# Patient Record
Sex: Female | Born: 1975 | Race: White | Hispanic: No | Marital: Married | Smoking: Current every day smoker
Health system: Southern US, Community
[De-identification: ages and names within clinical notes are randomized; demographics above are authoritative.]

## PROBLEM LIST (undated history)

## (undated) ENCOUNTER — Emergency Department (HOSPITAL_COMMUNITY): Discharge status: Against Medical Advice | Payer: BC Managed Care – PPO

## (undated) DIAGNOSIS — K219 Gastro-esophageal reflux disease without esophagitis: Secondary | ICD-10-CM

## (undated) DIAGNOSIS — D72825 Bandemia: Secondary | ICD-10-CM

## (undated) DIAGNOSIS — F419 Anxiety disorder, unspecified: Secondary | ICD-10-CM

## (undated) DIAGNOSIS — T8859XA Other complications of anesthesia, initial encounter: Secondary | ICD-10-CM

## (undated) DIAGNOSIS — D649 Anemia, unspecified: Secondary | ICD-10-CM

## (undated) DIAGNOSIS — D472 Monoclonal gammopathy: Secondary | ICD-10-CM

## (undated) DIAGNOSIS — G43909 Migraine, unspecified, not intractable, without status migrainosus: Secondary | ICD-10-CM

## (undated) DIAGNOSIS — E039 Hypothyroidism, unspecified: Secondary | ICD-10-CM

## (undated) DIAGNOSIS — Z8719 Personal history of other diseases of the digestive system: Secondary | ICD-10-CM

## (undated) DIAGNOSIS — R51 Headache: Secondary | ICD-10-CM

## (undated) DIAGNOSIS — M1990 Unspecified osteoarthritis, unspecified site: Secondary | ICD-10-CM

## (undated) DIAGNOSIS — K589 Irritable bowel syndrome without diarrhea: Secondary | ICD-10-CM

## (undated) DIAGNOSIS — I341 Nonrheumatic mitral (valve) prolapse: Secondary | ICD-10-CM

## (undated) DIAGNOSIS — R569 Unspecified convulsions: Secondary | ICD-10-CM

## (undated) DIAGNOSIS — M797 Fibromyalgia: Secondary | ICD-10-CM

## (undated) DIAGNOSIS — N809 Endometriosis, unspecified: Secondary | ICD-10-CM

## (undated) DIAGNOSIS — K5792 Diverticulitis of intestine, part unspecified, without perforation or abscess without bleeding: Secondary | ICD-10-CM

## (undated) DIAGNOSIS — I10 Essential (primary) hypertension: Secondary | ICD-10-CM

## (undated) DIAGNOSIS — I499 Cardiac arrhythmia, unspecified: Secondary | ICD-10-CM

## (undated) DIAGNOSIS — T4145XA Adverse effect of unspecified anesthetic, initial encounter: Secondary | ICD-10-CM

## (undated) DIAGNOSIS — M7660 Achilles tendinitis, unspecified leg: Secondary | ICD-10-CM

## (undated) DIAGNOSIS — R011 Cardiac murmur, unspecified: Secondary | ICD-10-CM

## (undated) DIAGNOSIS — J189 Pneumonia, unspecified organism: Secondary | ICD-10-CM

## (undated) HISTORY — DX: Migraine, unspecified, not intractable, without status migrainosus: G43.909

## (undated) HISTORY — PX: MOUTH SURGERY: SHX715

## (undated) HISTORY — PX: ABDOMINAL HYSTERECTOMY: SHX81

## (undated) HISTORY — PX: APPENDECTOMY: SHX54

## (undated) HISTORY — PX: ANTERIOR CERVICAL DECOMP/DISCECTOMY FUSION: SHX1161

## (undated) HISTORY — DX: Achilles tendinitis, unspecified leg: M76.60

## (undated) HISTORY — PX: CHOLECYSTECTOMY: SHX55

---
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
## 1998-09-02 ENCOUNTER — Encounter (HOSPITAL_COMMUNITY): Admission: RE | Admit: 1998-09-02 | Discharge: 1998-12-01 | Payer: Self-pay | Admitting: Cardiology

## 1998-11-05 ENCOUNTER — Other Ambulatory Visit: Admission: RE | Admit: 1998-11-05 | Discharge: 1998-11-05 | Payer: Self-pay | Admitting: Gastroenterology

## 1998-12-20 ENCOUNTER — Emergency Department (HOSPITAL_COMMUNITY): Admission: EM | Admit: 1998-12-20 | Discharge: 1998-12-20 | Payer: Self-pay | Admitting: Emergency Medicine

## 1998-12-20 ENCOUNTER — Encounter: Payer: Self-pay | Admitting: Emergency Medicine

## 1999-01-03 ENCOUNTER — Inpatient Hospital Stay (HOSPITAL_COMMUNITY): Admission: EM | Admit: 1999-01-03 | Discharge: 1999-01-05 | Payer: Self-pay | Admitting: Emergency Medicine

## 1999-01-05 ENCOUNTER — Encounter: Payer: Self-pay | Admitting: *Deleted

## 2000-11-25 ENCOUNTER — Emergency Department (HOSPITAL_COMMUNITY): Admission: EM | Admit: 2000-11-25 | Discharge: 2000-11-25 | Payer: Self-pay | Admitting: Emergency Medicine

## 2000-11-25 ENCOUNTER — Encounter: Payer: Self-pay | Admitting: Emergency Medicine

## 2000-12-06 ENCOUNTER — Encounter: Payer: Self-pay | Admitting: Neurology

## 2000-12-06 ENCOUNTER — Ambulatory Visit (HOSPITAL_COMMUNITY): Admission: RE | Admit: 2000-12-06 | Discharge: 2000-12-06 | Payer: Self-pay | Admitting: Neurology

## 2001-05-08 ENCOUNTER — Emergency Department (HOSPITAL_COMMUNITY): Admission: EM | Admit: 2001-05-08 | Discharge: 2001-05-09 | Payer: Self-pay | Admitting: Emergency Medicine

## 2001-12-15 ENCOUNTER — Observation Stay (HOSPITAL_COMMUNITY): Admission: EM | Admit: 2001-12-15 | Discharge: 2001-12-15 | Payer: Self-pay

## 2002-02-08 ENCOUNTER — Other Ambulatory Visit: Admission: RE | Admit: 2002-02-08 | Discharge: 2002-02-08 | Payer: Self-pay | Admitting: *Deleted

## 2002-04-05 ENCOUNTER — Encounter: Admission: RE | Admit: 2002-04-05 | Discharge: 2002-04-05 | Payer: Self-pay

## 2002-04-11 ENCOUNTER — Encounter: Payer: Self-pay | Admitting: Emergency Medicine

## 2002-04-11 ENCOUNTER — Emergency Department (HOSPITAL_COMMUNITY): Admission: EM | Admit: 2002-04-11 | Discharge: 2002-04-11 | Payer: Self-pay | Admitting: Emergency Medicine

## 2002-04-24 ENCOUNTER — Encounter: Payer: Self-pay | Admitting: Surgery

## 2002-04-27 ENCOUNTER — Encounter: Payer: Self-pay | Admitting: Surgery

## 2002-04-27 ENCOUNTER — Encounter (INDEPENDENT_AMBULATORY_CARE_PROVIDER_SITE_OTHER): Payer: Self-pay | Admitting: Specialist

## 2002-04-27 ENCOUNTER — Observation Stay (HOSPITAL_COMMUNITY): Admission: RE | Admit: 2002-04-27 | Discharge: 2002-04-28 | Payer: Self-pay | Admitting: Surgery

## 2002-07-02 ENCOUNTER — Emergency Department (HOSPITAL_COMMUNITY): Admission: EM | Admit: 2002-07-02 | Discharge: 2002-07-02 | Payer: Self-pay | Admitting: Emergency Medicine

## 2002-07-02 ENCOUNTER — Encounter: Payer: Self-pay | Admitting: Emergency Medicine

## 2002-10-11 ENCOUNTER — Encounter: Payer: Self-pay | Admitting: Emergency Medicine

## 2002-10-11 ENCOUNTER — Inpatient Hospital Stay (HOSPITAL_COMMUNITY): Admission: EM | Admit: 2002-10-11 | Discharge: 2002-10-13 | Payer: Self-pay

## 2002-10-11 ENCOUNTER — Encounter (INDEPENDENT_AMBULATORY_CARE_PROVIDER_SITE_OTHER): Payer: Self-pay | Admitting: *Deleted

## 2003-04-10 ENCOUNTER — Encounter: Payer: Self-pay | Admitting: Emergency Medicine

## 2003-04-10 ENCOUNTER — Emergency Department (HOSPITAL_COMMUNITY): Admission: EM | Admit: 2003-04-10 | Discharge: 2003-04-10 | Payer: Self-pay | Admitting: Emergency Medicine

## 2004-04-07 ENCOUNTER — Other Ambulatory Visit: Payer: Self-pay

## 2004-09-26 ENCOUNTER — Emergency Department (HOSPITAL_COMMUNITY): Admission: EM | Admit: 2004-09-26 | Discharge: 2004-09-26 | Payer: Self-pay | Admitting: Emergency Medicine

## 2005-01-21 ENCOUNTER — Emergency Department (HOSPITAL_COMMUNITY): Admission: EM | Admit: 2005-01-21 | Discharge: 2005-01-22 | Payer: Self-pay | Admitting: Emergency Medicine

## 2005-11-18 ENCOUNTER — Ambulatory Visit: Payer: Self-pay | Admitting: Gastroenterology

## 2006-04-20 ENCOUNTER — Encounter: Payer: Self-pay | Admitting: Emergency Medicine

## 2006-08-03 ENCOUNTER — Emergency Department: Payer: Self-pay | Admitting: Emergency Medicine

## 2007-01-19 ENCOUNTER — Emergency Department: Payer: Self-pay

## 2007-08-30 ENCOUNTER — Emergency Department: Payer: Self-pay | Admitting: Emergency Medicine

## 2007-08-30 ENCOUNTER — Other Ambulatory Visit: Payer: Self-pay

## 2007-09-04 ENCOUNTER — Ambulatory Visit: Payer: Self-pay | Admitting: Internal Medicine

## 2008-02-07 ENCOUNTER — Ambulatory Visit: Payer: Self-pay | Admitting: Internal Medicine

## 2008-02-22 ENCOUNTER — Ambulatory Visit: Payer: Self-pay | Admitting: Oncology

## 2008-04-09 ENCOUNTER — Ambulatory Visit: Payer: Self-pay | Admitting: Oncology

## 2008-04-11 LAB — CBC WITH DIFFERENTIAL (CANCER CENTER ONLY)
BASO#: 0.1 10*3/uL (ref 0.0–0.2)
BASO%: 0.8 % (ref 0.0–2.0)
EOS%: 4.4 % (ref 0.0–7.0)
HCT: 37.2 % (ref 34.8–46.6)
HGB: 12.7 g/dL (ref 11.6–15.9)
LYMPH#: 3.9 10*3/uL — ABNORMAL HIGH (ref 0.9–3.3)
MCHC: 34.2 g/dL (ref 32.0–36.0)
MONO#: 0.8 10*3/uL (ref 0.1–0.9)
NEUT#: 8.5 10*3/uL — ABNORMAL HIGH (ref 1.5–6.5)
NEUT%: 61.4 % (ref 39.6–80.0)
WBC: 13.9 10*3/uL — ABNORMAL HIGH (ref 3.9–10.0)

## 2008-11-22 ENCOUNTER — Emergency Department (HOSPITAL_COMMUNITY): Admission: EM | Admit: 2008-11-22 | Discharge: 2008-11-22 | Payer: Self-pay | Admitting: Emergency Medicine

## 2009-08-31 ENCOUNTER — Emergency Department: Payer: Self-pay | Admitting: Emergency Medicine

## 2009-12-04 IMAGING — CR DG CHEST 2V
1 series · 2 of 2 positions shown · non-contrast
Comparison: none

REASON FOR EXAM: Shortness of Breath
COMMENTS:

[Series 1: view not recorded · 0.17mm/px · 2 of 2 slices shown]
[im 1/2]
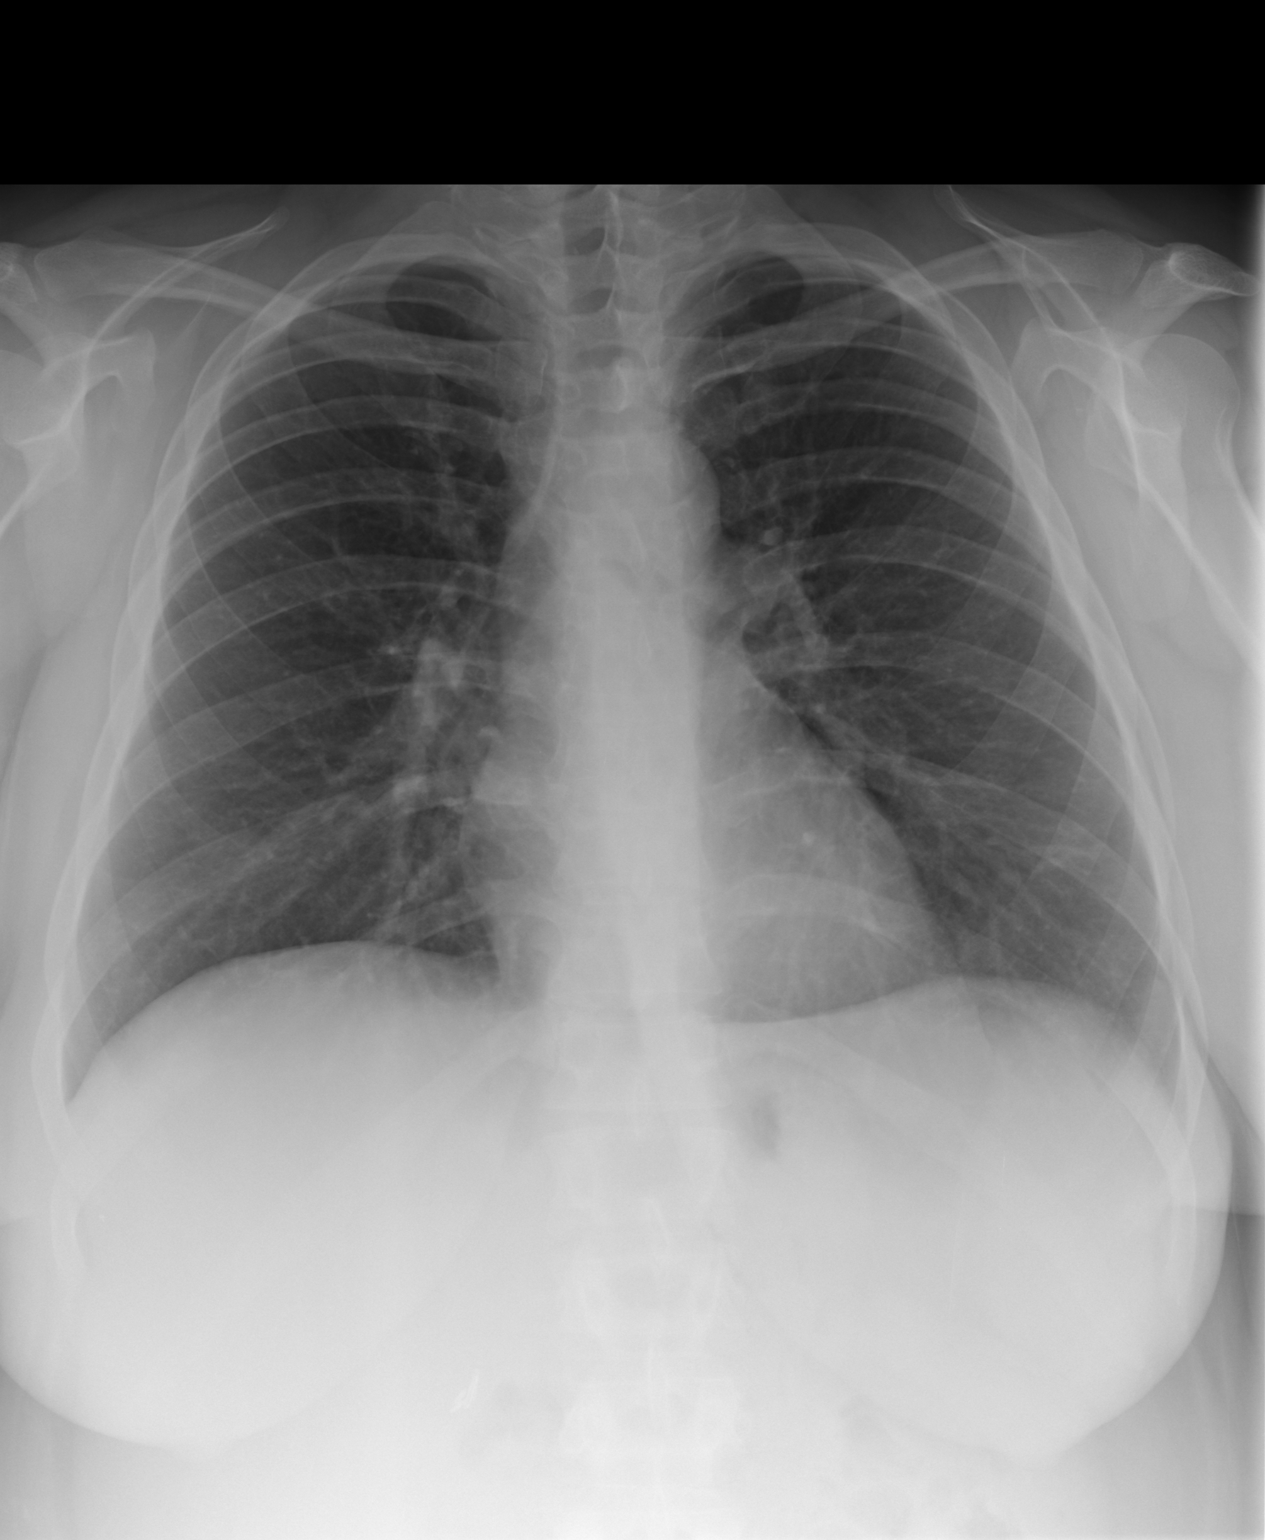
[im 2/2]
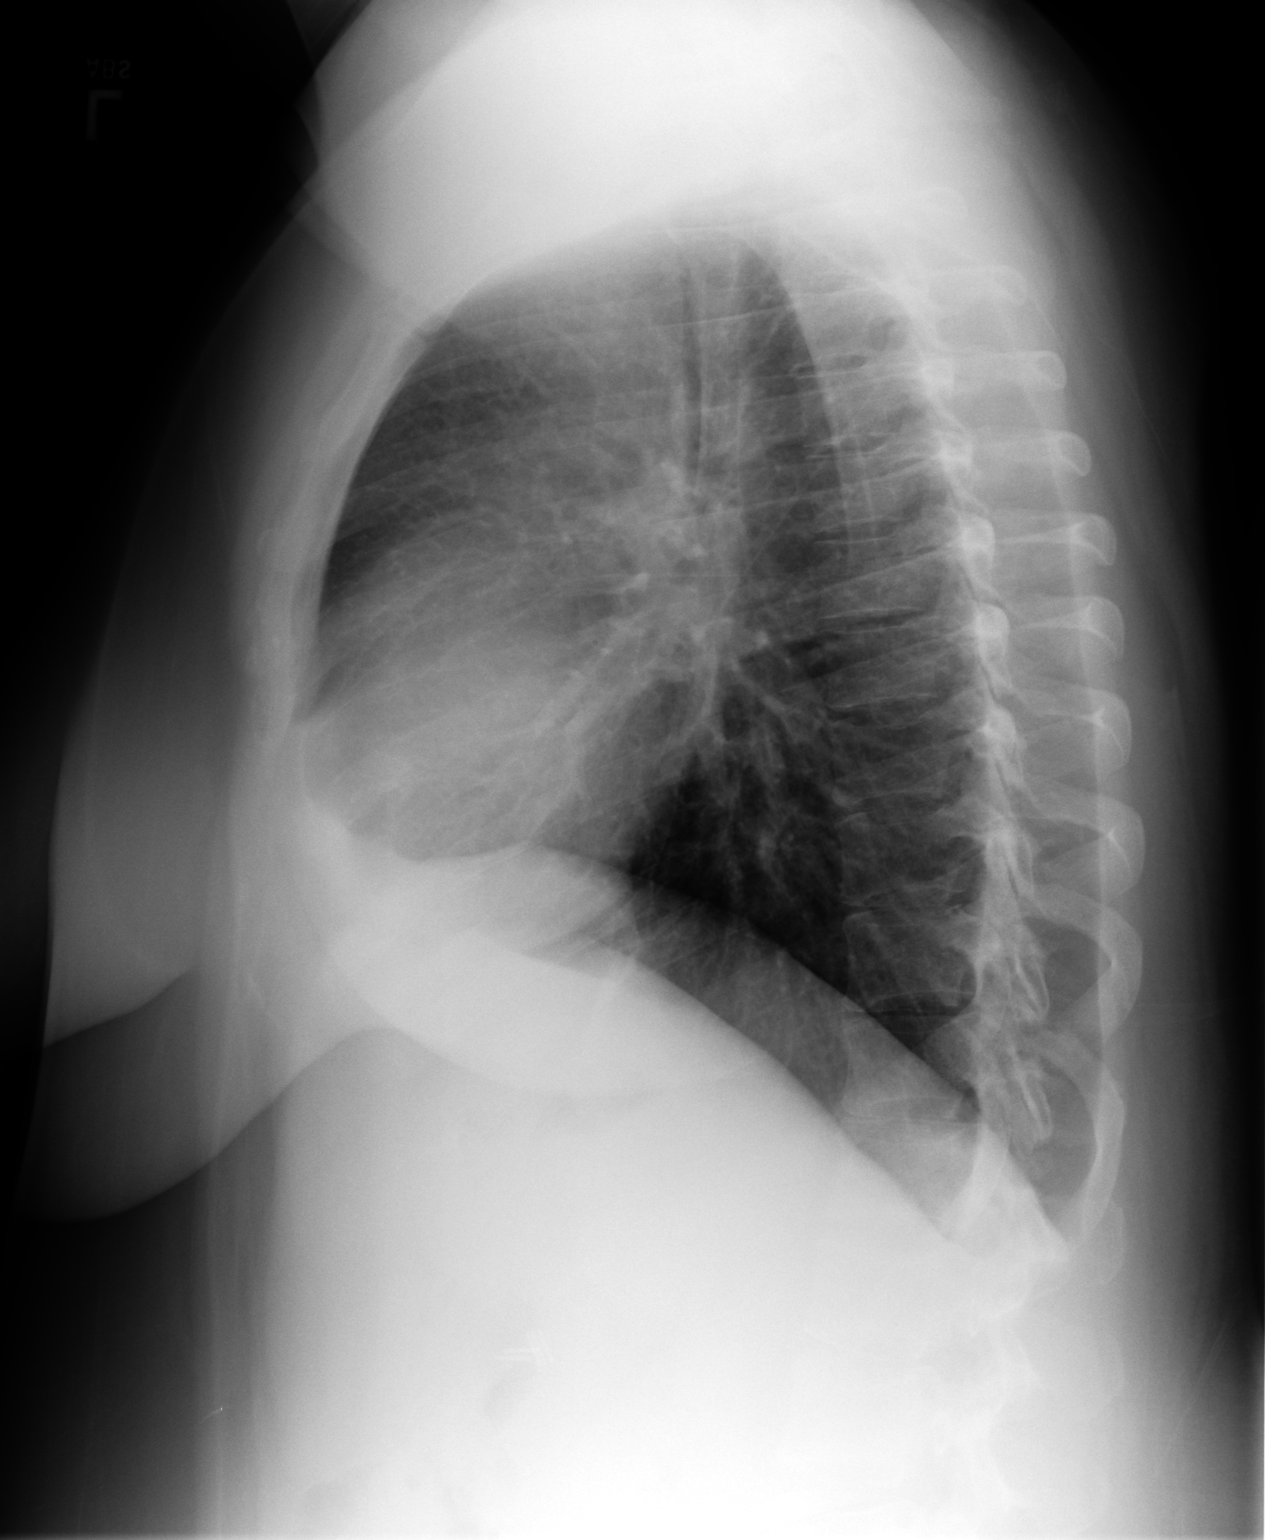

[2 of 2 positions shown; findings below may reference images not displayed]

PROCEDURE:     DXR - DXR CHEST PA (OR AP) AND LATERAL  - August 31, 2009  [DATE]

RESULT:     Comparison is made to study 30 August, 2007.

The lungs are adequately inflated. There is no focal infiltrate. The cardiac
silhouette is normal in size. The pulmonary vascularity is not engorged.
There is no pleural effusion.
IMPRESSION: I do not see evidence of acute cardiopulmonary abnormality.
I cannot exclude acute bronchitis in the appropriate clinical setting.

## 2011-05-07 NOTE — Op Note (Signed)
NAMEKAMILLA, HANDS                          ACCOUNT NO.:  [id number]   MEDICAL RECORD NO.:  [id number]                   PATIENT TYPE:  INP   LOCATION:  3016                                 FACILITY:  [facility]   PHYSICIAN:  Ollen Gross. Vernell Morgans, M.D.              DATE OF BIRTH:  18-Jan-1976   DATE OF PROCEDURE:  10/12/2002  DATE OF DISCHARGE:  10/13/2002                                 OPERATIVE REPORT   PREOPERATIVE DIAGNOSES:  Appendicitis.   POSTOPERATIVE DIAGNOSES:  Appendicitis.   PROCEDURE:  Laparoscopic appendectomy.   SURGEON:  Ollen Gross. Carolynne Edouard, M.D.   ANESTHESIA:  General endotracheal.   DESCRIPTION OF PROCEDURE:  After informed consent was obtained, the patient  was brought to the operating room, placed in a supine position on the  operating room table. After adequate induction of general anesthesia, the  patient's abdomen was prepped with Betadine and draped in the usual sterile  manner. An incision was made just below the umbilicus after infiltrating the  area with 0.25/% Marcaine. This incision was carried down to the  subcutaneous tissue using blunt dissection with the Kelly clamp and the [employer] retractors until the linea alba was identified. The linea alba was  incised with the 15 blade knife and each side was grasped with Kocher clamps  and elevated anteriorly. The preperitoneal space was then probed bluntly  with a hemostat until the peritoneum was opened and access was gained the  abdominal cavity. This portion of the procedure was very difficult due to  the patient's size, she was morbidly obese. Once this was accomplished, a #0  Vicryl pursestring stitch was placed in the fascia surrounding the opening,  a Hasson cannula was placed through the opening an anchored in place with  the previously placed Vicryl pursestring stitch. The abdomen was then  insufflated with carbon dioxide without difficulty. Once this was  accomplished, the patient was placed in  Trendelenburg position and rotated a  little bit left side down. The appendix was able to be visualized and did  appear to be acutely inflamed. Next, a small incision was made in the  suprapubic region and a 5 mm port was initially placed through this incision  to see if the appendix was mobile enough to work on. Once the port had been  placed and the appendix was able to be manipulated, a Kidner was placed  through the 5 mm port and the port was exchanged for a 12 mm port. Next, a  small stab incision was made just above the umbilicus and a 5 mm port was  placed bluntly through this incision into the abdominal cavity under direct  vision. A Glassman retractor was placed through the 12 mm port and a  harmonic scalpel was placed through the 5 mm port and the appendix was able  to be elevated. The mesoappendix was taken down with the harmonic  scalpel  until the base of the appendix was free. The Kingsley Spittle was then switched to  the 5 mm port and an endoscopic stapler was placed through the 12 mm port  and the staple was able to be placed across the base of the appendix without  difficulty and the stapler was clamped and fired dividing the appendix.  Next, an endoscopic bag was placed through the 12 mm port and the appendix  was placed within the bag and the bag was sealed. The abdomen was then  irrigated with copious amounts of saline. The laparoscope was then moved to  the 12 mm and a gallbladder grasper was placed through the Hasson cannula to  grasp the bag. The bag was removed with the Hasson cannula through the  infraumbilical port. The fascial defect was then closed with the previously  placed Vicryl pursestring stitch. The rest of the ports were then removed  under direct vision without difficulty and were all hemostatic. The skin  incisions were then closed with interrupted 4-0 Monocryl subcuticular  stitches, Benzoin, Steri-  Strips and sterile dressings were applied. The patient  tolerated the  procedure well. At the end of the case, all sponge, needle and instrument  counts were correct. The patient was awakened and taken to the recovery room  in stable condition.                                               Ollen Gross. Vernell Morgans, M.D.    PST/MEDQ  D:  10/16/2002  T:  10/16/2002  Job:  [id number]

## 2011-05-07 NOTE — Discharge Summary (Signed)
[place]  Patient:    Allison Martinez, Allison Martinez Visit Number: [id number] MRN: [id number]          Service Type: OBV Location: [id number] Attending Physician:  Edwyna Perfect Dictated by:   Jennette Kettle, M.D. Admit Date:  12/14/2001 Discharge Date: 12/15/2001   CC:         Andres Ege, M.D.   Discharge Summary  DISCHARGE DIAGNOSES: 1. Acute gastroenteritis. 2. History of depression. 3. History of hypothyroidism. 4. History of pneumonia in January 2000.  DISCHARGE MEDICATIONS: 1. Celexa 20 mg p.o. q.d. 2. Synthroid 0.05 mg p.o. q.d. 3. Imodium p.r.n. diarrhea.  FOLLOWUP:  The patient will follow up with Dr. Andres Ege on January 19, 2002, as previously scheduled.  Dr. Fayrene Fearing [place] the patients [place].  PROCEDURES AND DIAGNOSTIC STUDIES:  None.  HISTORY OF PRESENT ILLNESS:  Briefly, Allison Martinez is a 35 year old Caucasian female with a history of hypothyroidism, as well as depression, who presented on 12/14/01, with nausea, vomiting, and diarrhea which began on the morning of 12/14/01.  The patient states that she had been feeling sick that morning and was unable to eat anything.  The patient was able to pass flatus and a loose stool with it that morning.  The patient began vomiting approximately 8 to 10 times that day.  The patient denied any bright red blood per rectum or melena. The patient denied any blood with her emesis.  The patient describes her stool as being watery, yellow, and brown in nature, but no blood.  The patient complains of mild abdominal pain.  The patient states that she was handling a sick baby on Christmas Eve who had been diagnosed with some gastroenteritis.  ADMISSION LABORATORY DATA:  The patient had a CBC revealing a white count of 21.4, hemoglobin 14.0, platelets 352, with absolute neutrophil count of 20.4, with segs of 95%.  The patient had electrolytes revealing sodium  of 136, potassium 3.6, chloride 105, bicarbonate 24, BUN 12, creatinine 0.6, glucose 129.  The patients liver function tests were within normal limits.  The patient was noted to be slightly heme positive on fecal occult blood test, but had no gross blood on rectal examination.  HOSPITAL COURSE:  #1 - ACUTE GASTROENTERITIS:  The patient was admitted to the hospital for IV fluid hydration.  Likely etiology is viral versus bacterial.  The patients likely viral given history of sick contact with a baby, possible Rotavirus. The patients emesis decreased throughout her hospital stay, and she was able to tolerate p.o. on the day of discharge.  The patient was advised that further followup with her primary care physician at a regularly scheduled appointment on Tuesday, January 19, 2002, would be appropriate, of if symptoms worsened, such as noticing bright red blood per rectum or intractable nausea and vomiting, without able to take p.o., to return to the emergency room.  The patients white count subsequently dropped on the day after she was admitted to a level upon discharge of 17.0.  The patient was advised that Imodium as needed for the diarrhea.  #2 - HISTORY OF DEPRESSION:  The patient was started and placed on her home medication of Celexa, and will be discharged on her home medication of Celexa.  #3 - HISTORY OF HYPOTHYROIDISM:  The patient was started on her home dose of Synthroid.  DISCHARGE LABORATORY DATA:  On the day of discharge the patient had a CBC revealing a white  count decreased to 17.0 with absolute neutrophil count decreased to 15.5.  The patients hemoglobin was 12.7, platelets 326.  The patients electrolytes revealed sodium 138, potassium 3.4, chloride 106, bicarbonate 21, BUN 15, creatinine 0.6, glucose 121, calcium 8.2. Dictated by:   Jennette Kettle, M.D. Attending Physician:  Edwyna Perfect DD:  12/15/01 TD:  12/16/01 Job: [id number]

## 2012-01-31 ENCOUNTER — Inpatient Hospital Stay (HOSPITAL_COMMUNITY): Payer: BC Managed Care – PPO

## 2012-01-31 ENCOUNTER — Inpatient Hospital Stay (HOSPITAL_COMMUNITY)
Admission: AD | Admit: 2012-01-31 | Discharge: 2012-02-03 | DRG: 183 | Discharge status: Against Medical Advice | Payer: BC Managed Care – PPO | Source: Ambulatory Visit | Attending: Internal Medicine | Admitting: Internal Medicine

## 2012-01-31 ENCOUNTER — Encounter (HOSPITAL_COMMUNITY): Payer: Self-pay | Admitting: General Practice

## 2012-01-31 DIAGNOSIS — Z79899 Other long term (current) drug therapy: Secondary | ICD-10-CM

## 2012-01-31 DIAGNOSIS — R110 Nausea: Secondary | ICD-10-CM | POA: Diagnosis present

## 2012-01-31 DIAGNOSIS — E669 Obesity, unspecified: Secondary | ICD-10-CM | POA: Diagnosis present

## 2012-01-31 DIAGNOSIS — K5732 Diverticulitis of large intestine without perforation or abscess without bleeding: Principal | ICD-10-CM | POA: Diagnosis present

## 2012-01-31 DIAGNOSIS — E039 Hypothyroidism, unspecified: Secondary | ICD-10-CM | POA: Diagnosis present

## 2012-01-31 DIAGNOSIS — K5900 Constipation, unspecified: Secondary | ICD-10-CM | POA: Diagnosis present

## 2012-01-31 DIAGNOSIS — K5792 Diverticulitis of intestine, part unspecified, without perforation or abscess without bleeding: Secondary | ICD-10-CM | POA: Diagnosis present

## 2012-01-31 DIAGNOSIS — Z91013 Allergy to seafood: Secondary | ICD-10-CM

## 2012-01-31 DIAGNOSIS — I10 Essential (primary) hypertension: Secondary | ICD-10-CM | POA: Diagnosis present

## 2012-01-31 DIAGNOSIS — Z886 Allergy status to analgesic agent status: Secondary | ICD-10-CM

## 2012-01-31 DIAGNOSIS — Z882 Allergy status to sulfonamides status: Secondary | ICD-10-CM

## 2012-01-31 HISTORY — DX: Fibromyalgia: M79.7

## 2012-01-31 HISTORY — DX: Anemia, unspecified: D64.9

## 2012-01-31 HISTORY — DX: Hypothyroidism, unspecified: E03.9

## 2012-01-31 HISTORY — DX: Essential (primary) hypertension: I10

## 2012-01-31 HISTORY — DX: Pneumonia, unspecified organism: J18.9

## 2012-01-31 HISTORY — DX: Personal history of other diseases of the digestive system: Z87.19

## 2012-01-31 HISTORY — DX: Headache: R51

## 2012-01-31 HISTORY — DX: Diverticulitis of intestine, part unspecified, without perforation or abscess without bleeding: K57.92

## 2012-01-31 HISTORY — DX: Anxiety disorder, unspecified: F41.9

## 2012-01-31 LAB — COMPREHENSIVE METABOLIC PANEL
ALT: 23 U/L (ref 0–35)
AST: 15 U/L (ref 0–37)
CO2: 26 mEq/L (ref 19–32)
Chloride: 101 mEq/L (ref 96–112)
Creatinine, Ser: 0.65 mg/dL (ref 0.50–1.10)
GFR calc non Af Amer: >90 mL/min (ref >90)
Total Bilirubin: 0.3 mg/dL (ref 0.3–1.2)

## 2012-01-31 LAB — CBC
MCH: 29.3 pg (ref 26.0–34.0)
MCHC: 35.8 g/dL (ref 30.0–36.0)
Platelets: 313 10*3/uL (ref 150–400)
RBC: 4.82 MIL/uL (ref 3.87–5.11)
RDW: 12.8 % (ref 11.5–15.5)

## 2012-01-31 LAB — PROTIME-INR: Prothrombin Time: 14.3 seconds (ref 11.6–15.2)

## 2012-01-31 MED ORDER — ONDANSETRON HCL 4 MG/2ML IJ SOLN
4.0000 mg | Freq: Four times a day (QID) | INTRAMUSCULAR | Status: DC | PRN
  Administered 2012-02-03: 4 mg via INTRAVENOUS
  Filled 2012-01-31: qty 2

## 2012-01-31 MED ORDER — SODIUM CHLORIDE 0.9 % IV SOLN
1.0000 g | Freq: Three times a day (TID) | INTRAVENOUS | Status: DC
  Administered 2012-01-31 – 2012-02-03 (×10): 1 g via INTRAVENOUS
  Filled 2012-01-31 (×13): qty 1

## 2012-01-31 MED ORDER — IOHEXOL 300 MG/ML  SOLN: 20.0000 mL | INTRAMUSCULAR | Status: AC

## 2012-01-31 MED ORDER — TOPIRAMATE 25 MG PO TABS: 50.0000 mg | ORAL_TABLET | Freq: Two times a day (BID) | ORAL | Status: DC

## 2012-01-31 MED ORDER — HYDROMORPHONE HCL PF 1 MG/ML IJ SOLN
INTRAMUSCULAR | Status: AC
  Administered 2012-01-31: 1 mg via INTRAVENOUS
  Filled 2012-01-31: qty 1

## 2012-01-31 MED ORDER — IOHEXOL 300 MG/ML  SOLN
100.0000 mL | Freq: Once | INTRAMUSCULAR | Status: AC | PRN
  Administered 2012-01-31: 100 mL via INTRAVENOUS

## 2012-01-31 MED ORDER — WHITE PETROLATUM GEL
Status: AC
  Filled 2012-01-31: qty 5

## 2012-01-31 MED ORDER — HYDROCHLOROTHIAZIDE 25 MG PO TABS
25.0000 mg | ORAL_TABLET | Freq: Every day | ORAL | Status: DC
  Administered 2012-01-31 – 2012-02-03 (×4): 25 mg via ORAL
  Filled 2012-01-31 (×4): qty 1

## 2012-01-31 MED ORDER — TOPIRAMATE 25 MG PO TABS
50.0000 mg | ORAL_TABLET | Freq: Two times a day (BID) | ORAL | Status: DC
  Administered 2012-01-31 – 2012-02-03 (×6): 50 mg via ORAL
  Filled 2012-01-31 (×7): qty 2

## 2012-01-31 MED ORDER — ONDANSETRON HCL 4 MG PO TABS
4.0000 mg | ORAL_TABLET | Freq: Four times a day (QID) | ORAL | Status: DC | PRN
  Administered 2012-02-02: 4 mg via ORAL
  Filled 2012-01-31 (×2): qty 1

## 2012-01-31 MED ORDER — LEVOTHYROXINE SODIUM 75 MCG PO TABS
75.0000 ug | ORAL_TABLET | Freq: Every day | ORAL | Status: DC
  Administered 2012-01-31 – 2012-02-03 (×4): 75 ug via ORAL
  Filled 2012-01-31 (×5): qty 1

## 2012-01-31 MED ORDER — HYDROMORPHONE HCL PF 1 MG/ML IJ SOLN
1.0000 mg | INTRAMUSCULAR | Status: DC | PRN
  Administered 2012-01-31 – 2012-02-01 (×8): 1 mg via INTRAVENOUS
  Filled 2012-01-31 (×9): qty 1

## 2012-01-31 MED ORDER — KCL IN DEXTROSE-NACL 20-5-0.45 MEQ/L-%-% IV SOLN
INTRAVENOUS | Status: DC
  Administered 2012-01-31 (×2): via INTRAVENOUS
  Administered 2012-02-01: 100 mL/h via INTRAVENOUS
  Administered 2012-02-01: 14:00:00 via INTRAVENOUS
  Administered 2012-02-02: 1000 mL via INTRAVENOUS
  Administered 2012-02-02 – 2012-02-03 (×2): via INTRAVENOUS
  Filled 2012-01-31 (×9): qty 1000

## 2012-01-31 MED ORDER — ALPRAZOLAM 0.5 MG PO TABS
0.5000 mg | ORAL_TABLET | Freq: Two times a day (BID) | ORAL | Status: DC | PRN
  Administered 2012-01-31 – 2012-02-01 (×2): 0.5 mg via ORAL
  Filled 2012-01-31 (×2): qty 1

## 2012-01-31 MED ORDER — ALBUTEROL SULFATE (5 MG/ML) 0.5% IN NEBU
2.5000 mg | INHALATION_SOLUTION | RESPIRATORY_TRACT | Status: DC | PRN
  Administered 2012-02-01: 2.5 mg via RESPIRATORY_TRACT
  Filled 2012-01-31: qty 0.5

## 2012-01-31 MED ORDER — DOCUSATE SODIUM 100 MG PO CAPS
100.0000 mg | ORAL_CAPSULE | Freq: Two times a day (BID) | ORAL | Status: DC
  Administered 2012-01-31 – 2012-02-03 (×6): 100 mg via ORAL
  Filled 2012-01-31 (×7): qty 1

## 2012-01-31 NOTE — Progress Notes (Signed)
Utilization review complete 

## 2012-01-31 NOTE — H&P (Signed)
PCP:   Dr. Haydee Salter (internal medicine in [place]).   Chief Complaint:  Left lower abdominal pain. Left lower back pain  HPI: Pt is a 36 y/o CF that was recently diagnosed with Acute diverticulitis.  Presented for a follow up with her physician today after being discharged from [facility] on Levaquin and metronidazole for treatment of acute diverticulitis.  Pt reports that she took the medication as indicated but noticed that the discomfort got worse.  She states she has had pain for 2 weeks and improved while at [facility] but as an outpatient she developed discomfort on her outpatient regimen.  Since being on antibiotic and opiods she has had less bowel movements and reports that she had one BM yesterday but was hard and had to strain.  Denies any hematuria, BRBPR, fever, chills, SOB, cough.  Eating causes discomfort.  The pain is mostly located at [facility] of abdomen and radiates to her back on the left.  Pain is classified as sharp.  Has been associated with nausea but has been well controlled on zofran.  Allergies:   Allergies  Allergen Reactions  . Shellfish Allergy Anaphylaxis  . Sulfa Antibiotics Anaphylaxis  . Codeine Itching     No past medical history on file.  No past surgical history on file.  Prior to Admission medications   Medication Sig Start Date End Date Taking? Authorizing Provider  albuterol (PROVENTIL HFA;VENTOLIN HFA) 108 (90 BASE) MCG/ACT inhaler Inhale 2 puffs into the lungs every 6 (six) hours as needed. For shortness of breath away from home   Yes Historical Provider, MD  albuterol (PROVENTIL) (2.5 MG/3ML) 0.083% nebulizer solution Take 2.5 mg by nebulization 2 (two) times daily as needed. For shortness of breath   Yes Historical Provider, MD  ALPRAZolam (XANAX) 0.5 MG tablet Take 0.5 mg by mouth 2 (two) times daily as needed. For anxiety   Yes Historical Provider, MD  Cyanocobalamin (VITAMIN B-12 SL) Place 1 tablet under the tongue daily.   Yes  Historical Provider, MD  hydrochlorothiazide (HYDRODIURIL) 25 MG tablet Take 25 mg by mouth daily.   Yes Historical Provider, MD  levofloxacin (LEVAQUIN) 750 MG tablet Take 750 mg by mouth daily. For 14 days-colon pain 01/25/12 02/07/12 Yes Historical Provider, MD  levothyroxine (SYNTHROID, LEVOTHROID) 75 MCG tablet Take 75 mcg by mouth daily.   Yes Historical Provider, MD  meloxicam (MOBIC) 15 MG tablet Take 15 mg by mouth daily.   Yes Historical Provider, MD  metroNIDAZOLE (FLAGYL) 500 MG tablet Take 500 mg by mouth every 6 (six) hours. For 14 days-colon pain 01/25/12 02/07/12 Yes Historical Provider, MD  ondansetron (ZOFRAN) 4 MG tablet Take 4 mg by mouth every 8 (eight) hours as needed. For nausea   Yes Historical Provider, MD  oxycodone (OXY-IR) 5 MG capsule Take 10-20 mg by mouth every 4 (four) hours as needed. For pain   Yes Historical Provider, MD  oxyCODONE-acetaminophen (PERCOCET) 5-325 MG per tablet Take 1 tablet by mouth every 8 (eight) hours as needed. For pain   Yes Historical Provider, MD  topiramate (TOPAMAX) 50 MG tablet Take 50 mg by mouth 2 (two) times daily.   Yes Historical Provider, MD    Social History:  does not have a smoking history on file. She does not have any smokeless tobacco history on file. Her alcohol and drug histories not on file.  No family history on file.  Review of Systems:  Constitutional: Denies fever, chills, diaphoresis, appetite change and fatigue.  HEENT: Denies photophobia, eye pain, redness, hearing loss, ear pain, congestion, sore throat, rhinorrhea, sneezing, mouth sores, trouble swallowing, neck pain, neck stiffness and tinnitus.   Respiratory: Denies SOB, DOE, cough, chest tightness,  and wheezing.   Cardiovascular: Denies chest pain, palpitations and leg swelling.  Gastrointestinal: +nausea, + vomiting, + abdominal pain, diarrhea, + constipation, denies blood in stool and abdominal distention.  Genitourinary: Denies dysuria, urgency, frequency,  hematuria, flank pain and difficulty urinating.  Musculoskeletal: Denies myalgias, + back pain, joint swelling, arthralgias and gait problem.  Skin: Denies pallor, rash and wound.  Neurological: Denies dizziness, seizures, syncope, weakness, light-headedness, numbness and headaches.  Hematological: Denies adenopathy. Easy bruising, personal or family bleeding history  Psychiatric/Behavioral: Denies suicidal ideation, mood changes, confusion, nervousness, sleep disturbance and agitation   Physical Exam: Blood pressure 132/70, pulse 87, temperature 98.7 F (37.1 C), temperature source Oral, resp. rate 24, height 5\' 2"  (1.575 m), weight 114.579 kg (252 lb 9.6 oz), SpO2 96.00%. General: Alert, awake, oriented x3, in no acute distress. HEENT: No bruits, no goiter. Heart: Regular rate and rhythm, without murmurs, rubs, gallops. Lungs: Clear to auscultation bilaterally. Abdomen: Soft, Tenderness to palpation at LLQ no rebound tenderness no guarding negative murphys sign, nondistended, positive bowel sounds. Extremities: No clubbing cyanosis or edema with positive pedal pulses. Neuro: Grossly intact, nonfocal.    Labs on Admission:  No results found for this or any previous visit (from the past 48 hour(s)). Pt obtained labs at prior hospital and has labs on chart Please review for further details. Will order labs for this admission.  Last creatinine on 01/25/12 was 0.63 Radiological Exams on Admission: CT on 01/25/12 : Showed acute diverticulitis of the proximal sigmoid colon.   Assessment/Plan 1) Acute Diverticulitis:  At this juncture has failed outpatient po antibiotic regimen agree with admission for further investigation.  Will order CT of abdomen to evaluate for abcess or other pathology.  Place patient on meropenem IV.  Will continue to monitor vitals.  Place npo and give MIVF's.  Also will order CMP, CBC, Urinalysis.  2) constipation:  At this point will place patient on colace but  will await results of CT scan prior to administration.  Most likely secondary to recent outpatient opiod administration.  3) Hypothyroidism:  Will plan on continuing home regimen.  4) Nausea:  Most likely due to # 1  5) HTN:  At this point controlled on HCTZ.  Will continue for now.  Time Spent on Admission: >60 minutes discussing with outside health care team, Obtaining history, discussion with patient, physical exam, placing orders, documenting, and updating information systems.  Penny Pia [facility] Pager: [phone] 01/31/2012, 2:38 PM

## 2012-02-01 LAB — CBC
HCT: 36.4 % (ref 36.0–46.0)
MCH: 28.8 pg (ref 26.0–34.0)
MCHC: 35.2 g/dL (ref 30.0–36.0)
MCV: 82 fL (ref 78.0–100.0)
Platelets: 291 10*3/uL (ref 150–400)
RDW: 12.9 % (ref 11.5–15.5)
WBC: 13.2 10*3/uL — ABNORMAL HIGH (ref 4.0–10.5)

## 2012-02-01 LAB — BASIC METABOLIC PANEL
BUN: 7 mg/dL (ref 6–23)
CO2: 28 mEq/L (ref 19–32)
Calcium: 9.6 mg/dL (ref 8.4–10.5)
Chloride: 102 mEq/L (ref 96–112)
Creatinine, Ser: 0.68 mg/dL (ref 0.50–1.10)

## 2012-02-01 MED ORDER — OXYCODONE-ACETAMINOPHEN 5-325 MG PO TABS
1.0000 | ORAL_TABLET | Freq: Four times a day (QID) | ORAL | Status: DC | PRN
  Administered 2012-02-01: 2 via ORAL
  Administered 2012-02-02: 1 via ORAL
  Administered 2012-02-02: 2 via ORAL
  Administered 2012-02-02: 1 via ORAL
  Administered 2012-02-03 (×2): 2 via ORAL
  Filled 2012-02-01 (×5): qty 2
  Filled 2012-02-01: qty 1

## 2012-02-01 MED ORDER — HYDROMORPHONE HCL PF 1 MG/ML IJ SOLN
2.0000 mg | INTRAMUSCULAR | Status: DC | PRN
  Administered 2012-02-01 – 2012-02-03 (×7): 2 mg via INTRAVENOUS
  Filled 2012-02-01 (×7): qty 2

## 2012-02-01 MED ORDER — POLYETHYLENE GLYCOL 3350 17 G PO PACK
17.0000 g | PACK | Freq: Every day | ORAL | Status: DC
  Administered 2012-02-01 – 2012-02-03 (×3): 17 g via ORAL
  Filled 2012-02-01 (×3): qty 1

## 2012-02-01 NOTE — Progress Notes (Signed)
Subjective: Pt had many questions regarding her current condition.  I have spent > 15 minutes discussing current condition with patient.  Patient mentions that she is still has discomfort at her LLQ of her abdomen.  Still has not had any bowel movements.  Is requesting to eat today.  Reports that she did take colace yesterday.  Objective: Filed Vitals:   01/31/12 2153 02/01/12 0205 02/01/12 0530 02/01/12 1028  BP: 106/63 93/50 100/50 111/57  Pulse: 77 67 70 64  Temp: 98.3 F (36.8 C) 97.9 F (36.6 C) 97.8 F (36.6 C) 98 F (36.7 C)  TempSrc: Oral     Resp: 20 18 18 19   Height:      Weight:      SpO2: 96% 96% 97% 98%   Weight change:   Intake/Output Summary (Last 24 hours) at 02/01/12 1249 Last data filed at 02/01/12 0700  Gross per 24 hour  Intake   2795 ml  Output   1600 ml  Net   1195 ml    General: Alert, awake, oriented x3, in no acute distress.  HEENT: No bruits, no goiter.  Heart: Regular rate and rhythm, without murmurs, rubs, gallops.  Lungs: Clear to auscultation bilaterally.  Abdomen: Soft, Tenderness to palpation at LLQ no rebound tenderness no guarding negative murphys sign, nondistended, positive bowel sounds.  Extremities: No clubbing cyanosis or edema with positive pedal pulses.  Neuro: Grossly intact, nonfocal.    Lab Results:  Basename 02/01/12 0510 01/31/12 1524  NA 137 139  K 3.8 3.7  CL 102 101  CO2 28 26  GLUCOSE 109* 92  BUN 7 8  CREATININE 0.68 0.65  CALCIUM 9.6 9.7  MG -- --  PHOS -- --    Basename 01/31/12 1524  AST 15  ALT 23  ALKPHOS 96  BILITOT 0.3  PROT 7.5  ALBUMIN 3.7   No results found for this basename: LIPASE:2,AMYLASE:2 in the last 72 hours  Basename 02/01/12 0510 01/31/12 1524  WBC 13.2* 17.1*  NEUTROABS -- --  HGB 12.8 14.1  HCT 36.4 39.4  MCV 82.0 81.7  PLT 291 313   No results found for this basename: CKTOTAL:3,CKMB:3,CKMBINDEX:3,TROPONINI:3 in the last 72 hours No components found with this basename:  POCBNP:3 No results found for this basename: DDIMER:2 in the last 72 hours No results found for this basename: HGBA1C:2 in the last 72 hours No results found for this basename: CHOL:2,HDL:2,LDLCALC:2,TRIG:2,CHOLHDL:2,LDLDIRECT:2 in the last 72 hours No results found for this basename: TSH,T4TOTAL,FREET3,T3FREE,THYROIDAB in the last 72 hours No results found for this basename: VITAMINB12:2,FOLATE:2,FERRITIN:2,TIBC:2,IRON:2,RETICCTPCT:2 in the last 72 hours  Micro Results: No results found for this or any previous visit (from the past 240 hour(s)).  Studies/Results: Ct Abdomen Pelvis W Contrast  01/31/2012  *RADIOLOGY REPORT*  Clinical Data: Lower quadrant pain.  Recent acute diverticulitis.  CT ABDOMEN AND PELVIS WITH CONTRAST  Technique:  Multidetector CT imaging of the abdomen and pelvis was performed following the standard protocol during bolus administration of intravenous contrast.  Contrast: OMNIPAQUE IOHEXOL 300 MG/ML IV SOLN  Comparison: 01/25/2012  Findings: There is persistent mild focal diverticulitis in the proximal sigmoid portion the colon.  There is no abscess or perforation but pericolonic soft tissue inflammation persists.  The liver, spleen, pancreas, adrenal glands, and kidneys are normal.  Gallbladder has been removed.  Appendix has been removed.  Uterus and ovaries are normal.  No free air or free fluid.  No significant osseous abnormality.  IMPRESSION: Persistent essentially unchanged focal  diverticulitis of the proximal sigmoid portion of the colon.  Original Report Authenticated By: Gwynn Burly, M.D.   Acute Abdominal Series  01/31/2012  *RADIOLOGY REPORT*  Clinical Data: Abdominal discomfort.  Nausea, vomiting.  ACUTE ABDOMEN SERIES (ABDOMEN 2 VIEW & CHEST 1 VIEW)  Comparison: CT 01/25/2012  Findings: Linear densities at the left base, likely atelectasis. Right lung is clear.  Heart is normal size.  No effusions.  Moderate stool burden throughout the colon.  No  evidence of obstruction or free air.  No organomegaly or suspicious calcification.  No acute bony abnormality.  Prior cholecystectomy.  IMPRESSION: Left base atelectasis.  Moderate stool burden throughout the colon.  Original Report Authenticated By: Cyndie Chime, M.D.    Medications: I have reviewed the patient's current medications.   Patient Active Hospital Problem List: Diverticulitis (01/31/2012) - Continue IV antibiotics today is day 2 of meropenem. Patient is afebrile and WBC trending down 17 to 13.2 currently - monitor vitals check CBC tomorrow - consider switching patient to oral regimen once WBC count trends down. - CT was negative for perf or abcess.  Showed persistant unchanged focal diverticulitis of the proximal sigmoid portion of the colon.  Constipation (01/31/2012) At this point will add miralax to regimen.  Patient is on colace currently.  Recommended increasing fluid intake. Abd xray showed Moderate stool burden throughout colon.  Hypothyroidism (01/31/2012) Stable on home regimen of synthroid.  Nausea (01/31/2012) Resolving patient requesting to eat.  Will advance diet as tolerated will start with full liquid diet.  HTN (hypertension) (01/31/2012) Well controlled currently.   Disposition:  Pending improvement in pain and WBC normalization pt will need to be transitioned to oral regimen.  Failed levaquin and metronidazole as outpatient.   LOS: 1 day   Penny Pia M.D.  [facility] 02/01/2012, 12:49 PM

## 2012-02-01 NOTE — Progress Notes (Signed)
   CARE MANAGEMENT NOTE 02/01/2012  Patient:  Allison Martinez, Allison Martinez   Account Number:  [id number]  Date Initiated:  02/01/2012  Documentation initiated by:  Donn Pierini  Subjective/Objective Assessment:   Pt admitted with diverticulitis     Action/Plan:   PTA pt lived at home with spouse, was independent with ADLs   Anticipated DC Date:  02/04/2012   Anticipated DC Plan:  HOME/SELF CARE      DC Planning Services  CM consult      Choice offered to / List presented to:             Status of service:  In process, will continue to follow Medicare Important Message given?   (If response is "NO", the following Medicare IM given date fields will be blank) Date Medicare IM given:   Date Additional Medicare IM given:    Discharge Disposition:    Per UR Regulation:    Comments:  PCP- Haydee Salter  02/01/12- 1130- Donn Pierini RN, BSN [phone] Anticipate d/c home when medically stable, no anticipated d/c needs, CM to follow

## 2012-02-02 LAB — CBC
MCH: 28.4 pg (ref 26.0–34.0)
MCHC: 34.4 g/dL (ref 30.0–36.0)
Platelets: 292 10*3/uL (ref 150–400)
RDW: 13 % (ref 11.5–15.5)

## 2012-02-02 NOTE — Progress Notes (Signed)
Subjective: Patient seen and examined this am. Still has RLQ pain  Objective:  Vital signs in last 24 hours:  Filed Vitals:   02/02/12 0600 02/02/12 1000 02/02/12 1400 02/02/12 1800  BP: 102/85 111/69 118/65 103/63  Pulse: 89 62 68 82  Temp: 98.1 F (36.7 C) 98.3 F (36.8 C) 99 F (37.2 C) 98.7 F (37.1 C)  TempSrc: Oral Oral Oral Oral  Resp: 18 18 20 18   Height:      Weight:      SpO2: 99% 97% 95% 95%    Intake/Output from previous day:   Intake/Output Summary (Last 24 hours) at 02/02/12 1825 Last data filed at 02/02/12 1700  Gross per 24 hour  Intake 2772.5 ml  Output   2701 ml  Net   71.5 ml    Physical Exam:  General: middle aged obese in no acute distress. HEENT: no pallor, no icterus, moist oral mucosa, no JVD, no lymphadenopathy Heart: Normal  s1 &s2  Regular rate and rhythm, without murmurs, rubs, gallops. Lungs: Clear to auscultation bilaterally. Abdomen: Soft, tender to palpation over RLQ,  nondistended, positive bowel sounds. Extremities: No clubbing cyanosis or edema with positive pedal pulses. Neuro: Alert, awake, oriented x3, nonfocal.   Lab Results:  Basic Metabolic Panel:    Component Value Date/Time   NA 137 02/01/2012 0510   K 3.8 02/01/2012 0510   CL 102 02/01/2012 0510   CO2 28 02/01/2012 0510   BUN 7 02/01/2012 0510   CREATININE 0.68 02/01/2012 0510   GLUCOSE 109* 02/01/2012 0510   CALCIUM 9.6 02/01/2012 0510   CBC:    Component Value Date/Time   WBC 10.9* 02/02/2012 0630   WBC 13.9* 04/11/2008 1002   HGB 12.6 02/02/2012 0630   HGB 12.7 04/11/2008 1002   HCT 36.6 02/02/2012 0630   HCT 37.2 04/11/2008 1002   PLT 292 02/02/2012 0630   PLT 273 04/11/2008 1002   MCV 82.6 02/02/2012 0630   MCV 83 04/11/2008 1002   NEUTROABS 8.5* 04/11/2008 1002   LYMPHSABS 3.9* 04/11/2008 1002   EOSABS 0.6* 04/11/2008 1002   BASOSABS 0.1 04/11/2008 1002    No results found for this or any previous visit (from the past 240 hour(s)).  Studies/Results: Ct  Abdomen Pelvis W Contrast  01/31/2012  *RADIOLOGY REPORT*  Clinical Data: Lower quadrant pain.  Recent acute diverticulitis.  CT ABDOMEN AND PELVIS WITH CONTRAST  Technique:  Multidetector CT imaging of the abdomen and pelvis was performed following the standard protocol during bolus administration of intravenous contrast.  Contrast: OMNIPAQUE IOHEXOL 300 MG/ML IV SOLN  Comparison: 01/25/2012  Findings: There is persistent mild focal diverticulitis in the proximal sigmoid portion the colon.  There is no abscess or perforation but pericolonic soft tissue inflammation persists.  The liver, spleen, pancreas, adrenal glands, and kidneys are normal.  Gallbladder has been removed.  Appendix has been removed.  Uterus and ovaries are normal.  No free air or free fluid.  No significant osseous abnormality.  IMPRESSION: Persistent essentially unchanged focal diverticulitis of the proximal sigmoid portion of the colon.  Original Report Authenticated By: Gwynn Burly, M.D.    Medications: Scheduled Meds:   . docusate sodium  100 mg Oral BID  . hydrochlorothiazide  25 mg Oral Daily  . levothyroxine  75 mcg Oral QAC breakfast  . meropenem (MERREM) IV  1 g Intravenous Q8H  . polyethylene glycol  17 g Oral Daily  . topiramate  50 mg Oral BID  Continuous Infusions:   . dextrose 5 % and 0.45 % NaCl with KCl 20 mEq/L 50 mL/hr at 02/02/12 1341   PRN Meds:.albuterol, ALPRAZolam, HYDROmorphone, ondansetron (ZOFRAN) IV, ondansetron, oxyCODONE-acetaminophen  Assessment 36 Y/O obese Female with hx of HTN, hypothyroidism presented with relapse of her diverticulitis.   Plan:  *Acute Diverticulitis Patient informs having several episodes of similar pain in past but never needed to be hospitalized Will cont on full liquids  cont meropenem IV Patient is at high risk for relapse and recurrence given her age appreciate  GI recommendations Cont pain meds   HTN stable Cont HCTZ  Hypothyroidism  Cont  synthroid   DVT prophylaxis     LOS: 2 days   Chelsey Redondo 02/02/2012, 6:25 PM

## 2012-02-02 NOTE — Progress Notes (Signed)
Subjective: Improving, but still present, left lower quadrant pain.  Objective: Vital signs in last 24 hours: Temp:  [98.1 F (36.7 C)-98.6 F (37 C)] 98.3 F (36.8 C) (02/13 1000) Pulse Rate:  [62-89] 62  (02/13 1000) Resp:  [18] 18  (02/13 1000) BP: (95-128)/(46-85) 111/69 mmHg (02/13 1000) SpO2:  [93 %-99 %] 97 % (02/13 1000) Weight change:  Last BM Date: 01/30/12  PE: GEN:  Overweight ABD:  Left lower quadrant tenderness with mild voluntary guarding; no peritonitis  Assessment:  1.  Diverticulitis, confirmed CT this admission (sigmoid) without abscess or perforation, slowly improving.  Plan:  1.  Continue intravenous antibiotics. 2.  I would be very reluctant to advance diet past full liquids until pain has essentially resolved. 3.  We will follow with you.   Allison Martinez 02/02/2012, 2:02 PM

## 2012-02-02 NOTE — Progress Notes (Signed)
Was asked to do a 2nd PIV assessment.  Pt. Requested not to have an IV started.   "I want to go home"   "I don't know why they are keeping me here"   "I was told there is nothing else they can do for me"    Requesting to talk with her doctor.   No IV attempts done.   Primary RN, Clayborne Dana, made aware of  Situation.

## 2012-02-03 MED ORDER — POLYETHYLENE GLYCOL 3350 17 G PO PACK
17.0000 g | PACK | Freq: Once | ORAL | Status: AC
  Administered 2012-02-03: 17 g via ORAL
  Filled 2012-02-03: qty 1

## 2012-02-03 MED ORDER — HYDROMORPHONE HCL PF 1 MG/ML IJ SOLN: 2.0000 mg | INTRAMUSCULAR | Status: DC | PRN

## 2012-02-03 MED ORDER — ALUM & MAG HYDROXIDE-SIMETH 200-200-20 MG/5ML PO SUSP
15.0000 mL | Freq: Four times a day (QID) | ORAL | Status: DC | PRN
  Administered 2012-02-03: 15 mL via ORAL
  Filled 2012-02-03: qty 30

## 2012-02-03 NOTE — Progress Notes (Signed)
Subjective: Pain improving, but not resolved.  Objective: Vital signs in last 24 hours: Temp:  [98 F (36.7 C)-99 F (37.2 C)] 98 F (36.7 C) (02/14 0624) Pulse Rate:  [56-82] 56  (02/14 0624) Resp:  [18-20] 18  (02/14 0624) BP: (101-118)/(50-69) 102/54 mmHg (02/14 0624) SpO2:  [95 %-99 %] 97 % (02/14 0624) Weight change:  Last BM Date: 02/02/12  PE: GEN:  NAD ABD:  Less LLQ tenderness; + BS, no peritonitis  Lab Results: CBC    Component Value Date/Time   WBC 10.9* 02/02/2012 0630   WBC 13.9* 04/11/2008 1002   RBC 4.43 02/02/2012 0630   HGB 12.6 02/02/2012 0630   HGB 12.7 04/11/2008 1002   HCT 36.6 02/02/2012 0630   HCT 37.2 04/11/2008 1002   PLT 292 02/02/2012 0630   PLT 273 04/11/2008 1002   MCV 82.6 02/02/2012 0630   MCV 83 04/11/2008 1002   MCH 28.4 02/02/2012 0630   MCH 28.5 04/11/2008 1002   MCHC 34.4 02/02/2012 0630   MCHC 34.2 04/11/2008 1002   RDW 13.0 02/02/2012 0630   RDW 12.1 04/11/2008 1002   LYMPHSABS 3.9* 04/11/2008 1002   EOSABS 0.6* 04/11/2008 1002   BASOSABS 0.1 04/11/2008 1002    Assessment:  1.  Sigmoid diverticulitis, improving.  Plan:  1.  Full liquids and IV antibiotics for another 24 hours. 2.  If continues to improve, can advance diet tomorrow and consider switch to oral antibiotics tomorrow as well. 3.  Will follow.   Freddy Jaksch 02/03/2012, 7:54 AM

## 2012-02-03 NOTE — Progress Notes (Signed)
IV site infiltrated, IV team unable to restart at this time, patient stated if they could just stop IV. MD made aware. New order given and noted.

## 2012-02-03 NOTE — Progress Notes (Signed)
Pt is a very difficult stick. Several iv team members have tried to start a piv on pt. Iv team RN on the floor now and says that pt has good foot veins if this writer can get an order. Maren Reamer NP paged. Order received for foot stick if necessary to get an IV.

## 2012-02-03 NOTE — Progress Notes (Signed)
Subjective: Patient seen and examined this am. informs LLQ pain slightly better.  Objective:  Vital signs in last 24 hours:  Filed Vitals:   02/02/12 2235 02/03/12 0139 02/03/12 0624 02/03/12 1406  BP: 102/50 101/59 102/54 117/61  Pulse: 72 59 56 62  Temp: 98.7 F (37.1 C) 98.2 F (36.8 C) 98 F (36.7 C) 97.9 F (36.6 C)  TempSrc: Oral Oral Oral Oral  Resp: 18 18 18 16   Height:      Weight:      SpO2: 97% 99% 97% 98%    Intake/Output from previous day:   Intake/Output Summary (Last 24 hours) at 02/03/12 1622 Last data filed at 02/03/12 1421  Gross per 24 hour  Intake   1230 ml  Output   2000 ml  Net   -770 ml    Physical Exam:  General:middle aged obese female  in no acute distress. HEENT: no pallor, no icterus, moist oral mucosa, no JVD, no lymphadenopathy Heart: Normal  s1 &s2  Regular rate and rhythm, without murmurs, rubs, gallops. Lungs: Clear to auscultation bilaterally. Abdomen: Soft, tender over LLQ,  nondistended, positive bowel sounds. Extremities: No clubbing cyanosis or edema with positive pedal pulses. Neuro: Alert, awake, oriented x3, nonfocal.   Lab Results:  Basic Metabolic Panel:    Component Value Date/Time   NA 137 02/01/2012 0510   K 3.8 02/01/2012 0510   CL 102 02/01/2012 0510   CO2 28 02/01/2012 0510   BUN 7 02/01/2012 0510   CREATININE 0.68 02/01/2012 0510   GLUCOSE 109* 02/01/2012 0510   CALCIUM 9.6 02/01/2012 0510   CBC:    Component Value Date/Time   WBC 10.9* 02/02/2012 0630   WBC 13.9* 04/11/2008 1002   HGB 12.6 02/02/2012 0630   HGB 12.7 04/11/2008 1002   HCT 36.6 02/02/2012 0630   HCT 37.2 04/11/2008 1002   PLT 292 02/02/2012 0630   PLT 273 04/11/2008 1002   MCV 82.6 02/02/2012 0630   MCV 83 04/11/2008 1002   NEUTROABS 8.5* 04/11/2008 1002   LYMPHSABS 3.9* 04/11/2008 1002   EOSABS 0.6* 04/11/2008 1002   BASOSABS 0.1 04/11/2008 1002    No results found for this or any previous visit (from the past 240  hour(s)).  Studies/Results: No results found.  Medications: Scheduled Meds:   . docusate sodium  100 mg Oral BID  . hydrochlorothiazide  25 mg Oral Daily  . levothyroxine  75 mcg Oral QAC breakfast  . meropenem (MERREM) IV  1 g Intravenous Q8H  . polyethylene glycol  17 g Oral Daily  . polyethylene glycol  17 g Oral Once  . topiramate  50 mg Oral BID   Continuous Infusions:   . dextrose 5 % and 0.45 % NaCl with KCl 20 mEq/L 50 mL/hr at 02/03/12 1504   PRN Meds:.albuterol, ALPRAZolam, alum & mag hydroxide-simeth, HYDROmorphone, ondansetron (ZOFRAN) IV, ondansetron, oxyCODONE-acetaminophen  Assessment  35 Y/O obese Female with hx of HTN, hypothyroidism presented with relapse of her diverticulitis.   Plan:  *Acute sigmoid Diverticulitis  Patient informs having several episodes of similar pain in past but never needed to be hospitalized  Will cont on full liquids  cont meropenem IV  Patient is at high risk for relapse and recurrence given her age  appreciate GI recommendations , cont IV abx for today and on full liquid diet Cont pain meds   HTN  stable  Cont HCTZ   Hypothyroidism  Cont synthroid   DVT prophylaxis  LOS: 3 days   Allison Martinez 02/03/2012, 4:22 PM

## 2012-02-03 NOTE — Progress Notes (Signed)
Utilization review completed.  

## 2012-02-04 NOTE — Discharge Summary (Signed)
Patient ID: CHERITY BLICKENSTAFF MRN: [id number] DOB/AGE: [birth year] 37 y.o.  Admit date: 01/31/2012 Discharge date: 02/04/2012  Primary Care Physician:  No primary provider on file.  Discharge Diagnoses:     Principal Problem:  *acute sigmoid Diverticulitis  Active Problems:  Constipation  Hypothyroidism  Nausea  HTN (hypertension)   Medication List  As of 02/04/2012  7:35 AM   ASK your doctor about these medications         albuterol 108 (90 BASE) MCG/ACT inhaler   Commonly known as: PROVENTIL HFA;VENTOLIN HFA   Inhale 2 puffs into the lungs every 6 (six) hours as needed. For shortness of breath away from home      albuterol (2.5 MG/3ML) 0.083% nebulizer solution   Commonly known as: PROVENTIL   Take 2.5 mg by nebulization 2 (two) times daily as needed. For shortness of breath      ALPRAZolam 0.5 MG tablet   Commonly known as: XANAX   Take 0.5 mg by mouth 2 (two) times daily as needed. For anxiety      hydrochlorothiazide 25 MG tablet   Commonly known as: HYDRODIURIL   Take 25 mg by mouth daily.      levofloxacin 750 MG tablet   Commonly known as: LEVAQUIN   Take 750 mg by mouth daily. For 14 days-colon pain      levothyroxine 75 MCG tablet   Commonly known as: SYNTHROID, LEVOTHROID   Take 75 mcg by mouth daily.      meloxicam 15 MG tablet   Commonly known as: MOBIC   Take 15 mg by mouth daily.      metroNIDAZOLE 500 MG tablet   Commonly known as: FLAGYL   Take 500 mg by mouth every 6 (six) hours. For 14 days-colon pain      ondansetron 4 MG tablet   Commonly known as: ZOFRAN   Take 4 mg by mouth every 8 (eight) hours as needed. For nausea      oxycodone 5 MG capsule   Commonly known as: OXY-IR   Take 10-20 mg by mouth every 4 (four) hours as needed. For pain      oxyCODONE-acetaminophen 5-325 MG per tablet   Commonly known as: PERCOCET   Take 1 tablet by mouth every 8 (eight) hours as needed. For pain      topiramate 50 MG tablet   Commonly known as:  TOPAMAX   Take 50 mg by mouth 2 (two) times daily.      VITAMIN B-12 SL   Place 1 tablet under the tongue daily.            Disposition and Follow-up:  Signed out AMA  Consults:   [facility] ( GI)  Significant Diagnostic Studies:  Ct Abdomen Pelvis W Contrast  01/31/2012  *RADIOLOGY REPORT*  Clinical Data: Lower quadrant pain.  Recent acute diverticulitis.  CT ABDOMEN AND PELVIS WITH CONTRAST  Technique:  Multidetector CT imaging of the abdomen and pelvis was performed following the standard protocol during bolus administration of intravenous contrast.  Contrast: OMNIPAQUE IOHEXOL 300 MG/ML IV SOLN  Comparison: 01/25/2012  Findings: There is persistent mild focal diverticulitis in the proximal sigmoid portion the colon.  There is no abscess or perforation but pericolonic soft tissue inflammation persists.  The liver, spleen, pancreas, adrenal glands, and kidneys are normal.  Gallbladder has been removed.  Appendix has been removed.  Uterus and ovaries are normal.  No free air or free fluid.  No significant osseous abnormality.  IMPRESSION: Persistent essentially unchanged focal diverticulitis of the proximal sigmoid portion of the colon.  Original Report Authenticated By: Gwynn Burly, M.D.   Acute Abdominal Series  01/31/2012  *RADIOLOGY REPORT*  Clinical Data: Abdominal discomfort.  Nausea, vomiting.  ACUTE ABDOMEN SERIES (ABDOMEN 2 VIEW & CHEST 1 VIEW)  Comparison: CT 01/25/2012  Findings: Linear densities at the left base, likely atelectasis. Right lung is clear.  Heart is normal size.  No effusions.  Moderate stool burden throughout the colon.  No evidence of obstruction or free air.  No organomegaly or suspicious calcification.  No acute bony abnormality.  Prior cholecystectomy.  IMPRESSION: Left base atelectasis.  Moderate stool burden throughout the colon.  Original Report Authenticated By: Cyndie Chime, M.D.    Brief H and P: For complete details please refer to admission H  and P, but in brief Pt is a 36 y/o CF that was recently diagnosed with Acute diverticulitis. Presented for a follow up with her physician today after being discharged from [facility] on Levaquin and metronidazole for treatment of acute diverticulitis. Pt reports that she took the medication as indicated but noticed that the discomfort got worse. She states she has had pain for 2 weeks and improved while at [facility] but as an outpatient she developed discomfort on her outpatient regimen. Since being on antibiotic and opiods she has had less bowel movements and reports that she had one BM yesterday but was hard and had to strain. Denies any hematuria, BRBPR, fever, chills, SOB, cough.   Physical Exam on Discharge:  Filed Vitals:   02/03/12 0139 02/03/12 0624 02/03/12 1406 02/03/12 2155  BP: 101/59 102/54 117/61 117/64  Pulse: 59 56 62 67  Temp: 98.2 F (36.8 C) 98 F (36.7 C) 97.9 F (36.6 C) 97.9 F (36.6 C)  TempSrc: Oral Oral Oral Oral  Resp: 18 18 16 18   Height:      Weight:      SpO2: 99% 97% 98% 98%     Intake/Output Summary (Last 24 hours) at 02/04/12 0735 Last data filed at 02/03/12 1421  Gross per 24 hour  Intake    350 ml  Output    800 ml  Net   -450 ml   PHYSICAL EXAM FROM 2/14 General: Alert, awake, oriented x3, in no acute distress. HEENT: No bruits, no goiter. Heart: Regular rate and rhythm, without murmurs, rubs, gallops. Lungs: Clear to auscultation bilaterally. Abdomen: Soft, LLQ tenderness,  nondistended, positive bowel sounds. Extremities: No clubbing cyanosis or edema with positive pedal pulses. Neuro: Grossly intact, nonfocal.  CBC:    Component Value Date/Time   WBC 10.9* 02/02/2012 0630   WBC 13.9* 04/11/2008 1002   HGB 12.6 02/02/2012 0630   HGB 12.7 04/11/2008 1002   HCT 36.6 02/02/2012 0630   HCT 37.2 04/11/2008 1002   PLT 292 02/02/2012 0630   PLT 273 04/11/2008 1002   MCV 82.6 02/02/2012 0630   MCV 83 04/11/2008 1002   NEUTROABS 8.5*  04/11/2008 1002   LYMPHSABS 3.9* 04/11/2008 1002   EOSABS 0.6* 04/11/2008 1002   BASOSABS 0.1 04/11/2008 1002    Basic Metabolic Panel:    Component Value Date/Time   NA 137 02/01/2012 0510   K 3.8 02/01/2012 0510   CL 102 02/01/2012 0510   CO2 28 02/01/2012 0510   BUN 7 02/01/2012 0510   CREATININE 0.68 02/01/2012 0510   GLUCOSE 109* 02/01/2012 0510   CALCIUM 9.6 02/01/2012 0510    Hospital Course:  Patient admitted and started on IV imipenem. She was recently discharged home from ED on po cipro and flagyl which she took for 5 days . She presented again with acute sigmoid diverticulitis. She was given IV fluids, pain meds and slowly advanced her diet. Given her age < 40 years she is at high risk for recurrence. GI consult called who recommended continuing IV abx and slowly advancing diet.  Patient had left the hospital without the nursing staff knowing about it last evening.( 2/14)  Time spent on Discharge: 25 minutes  Signed: Eddie North 02/04/2012, 7:35 AM

## 2012-02-04 NOTE — Progress Notes (Signed)
   CARE MANAGEMENT NOTE 02/04/2012  Patient:  LYSA, LIVENGOOD   Account Number:  [id number]  Date Initiated:  02/01/2012  Documentation initiated by:  Donn Pierini  Subjective/Objective Assessment:   Pt admitted with diverticulitis     Action/Plan:   PTA pt lived at home with spouse, was independent with ADLs   Anticipated DC Date:  02/04/2012   Anticipated DC Plan:  HOME/SELF CARE      DC Planning Services  CM consult      Choice offered to / List presented to:             Status of service:  Completed, signed off Medicare Important Message given?   (If response is "NO", the following Medicare IM given date fields will be blank) Date Medicare IM given:   Date Additional Medicare IM given:    Discharge Disposition:  HOME/SELF CARE  Per UR Regulation:    Comments:  PCP- Haydee Salter  02/04/12 8:57 [place] [phone] pt dc to home , no needs identified.  02/01/12- 1130- Donn Pierini RN, BSN [phone] Anticipate d/c home when medically stable, no anticipated d/c needs, CM to follow

## 2012-09-01 ENCOUNTER — Emergency Department (HOSPITAL_COMMUNITY)
Admission: EM | Admit: 2012-09-01 | Discharge: 2012-09-01 | Disposition: A | Discharge status: Home Health | Payer: BC Managed Care – PPO | Attending: Emergency Medicine | Admitting: Emergency Medicine

## 2012-09-01 ENCOUNTER — Emergency Department (HOSPITAL_COMMUNITY): Payer: BC Managed Care – PPO

## 2012-09-01 ENCOUNTER — Encounter (HOSPITAL_COMMUNITY): Payer: Self-pay | Admitting: *Deleted

## 2012-09-01 DIAGNOSIS — Z885 Allergy status to narcotic agent status: Secondary | ICD-10-CM | POA: Insufficient documentation

## 2012-09-01 DIAGNOSIS — M25519 Pain in unspecified shoulder: Secondary | ICD-10-CM | POA: Insufficient documentation

## 2012-09-01 DIAGNOSIS — M7918 Myalgia, other site: Secondary | ICD-10-CM

## 2012-09-01 DIAGNOSIS — Z882 Allergy status to sulfonamides status: Secondary | ICD-10-CM | POA: Insufficient documentation

## 2012-09-01 DIAGNOSIS — F17200 Nicotine dependence, unspecified, uncomplicated: Secondary | ICD-10-CM | POA: Insufficient documentation

## 2012-09-01 DIAGNOSIS — Z91013 Allergy to seafood: Secondary | ICD-10-CM | POA: Insufficient documentation

## 2012-09-01 DIAGNOSIS — J45901 Unspecified asthma with (acute) exacerbation: Secondary | ICD-10-CM | POA: Insufficient documentation

## 2012-09-01 DIAGNOSIS — IMO0001 Reserved for inherently not codable concepts without codable children: Secondary | ICD-10-CM | POA: Insufficient documentation

## 2012-09-01 DIAGNOSIS — F411 Generalized anxiety disorder: Secondary | ICD-10-CM | POA: Insufficient documentation

## 2012-09-01 DIAGNOSIS — I10 Essential (primary) hypertension: Secondary | ICD-10-CM | POA: Insufficient documentation

## 2012-09-01 LAB — POCT I-STAT TROPONIN I: Troponin i, poc: 0 ng/mL (ref 0.00–0.08)

## 2012-09-01 LAB — POCT I-STAT, CHEM 8
BUN: 8 mg/dL (ref 6–23)
Calcium, Ion: 1.23 mmol/L (ref 1.12–1.23)
HCT: 38 % (ref 36.0–46.0)
Hemoglobin: 12.9 g/dL (ref 12.0–15.0)
Sodium: 141 mEq/L (ref 135–145)
TCO2: 24 mmol/L (ref 0–100)

## 2012-09-01 LAB — D-DIMER, QUANTITATIVE: D-Dimer, Quant: <0.27 ug/mL-FEU (ref 0.00–0.48)

## 2012-09-01 MED ORDER — ALBUTEROL SULFATE (5 MG/ML) 0.5% IN NEBU
5.0000 mg | INHALATION_SOLUTION | Freq: Once | RESPIRATORY_TRACT | Status: AC
  Administered 2012-09-01: 5 mg via RESPIRATORY_TRACT
  Filled 2012-09-01: qty 1

## 2012-09-01 MED ORDER — METHOCARBAMOL 500 MG PO TABS: 1000.0000 mg | ORAL_TABLET | Freq: Four times a day (QID) | ORAL | Status: AC | PRN

## 2012-09-01 MED ORDER — PREDNISONE 20 MG PO TABS
60.0000 mg | ORAL_TABLET | Freq: Once | ORAL | Status: AC
  Administered 2012-09-01: 60 mg via ORAL
  Filled 2012-09-01: qty 3

## 2012-09-01 MED ORDER — OXYCODONE-ACETAMINOPHEN 5-325 MG PO TABS
2.0000 | ORAL_TABLET | Freq: Once | ORAL | Status: AC
  Administered 2012-09-01: 2 via ORAL
  Filled 2012-09-01: qty 2

## 2012-09-01 MED ORDER — IPRATROPIUM BROMIDE 0.02 % IN SOLN
0.5000 mg | Freq: Once | RESPIRATORY_TRACT | Status: AC
  Administered 2012-09-01: 0.5 mg via RESPIRATORY_TRACT
  Filled 2012-09-01: qty 2.5

## 2012-09-01 MED ORDER — ALBUTEROL SULFATE (5 MG/ML) 0.5% IN NEBU
5.0000 mg | INHALATION_SOLUTION | Freq: Once | RESPIRATORY_TRACT | Status: AC
  Administered 2012-09-01: 5 mg via RESPIRATORY_TRACT
  Filled 2012-09-01: qty 0.5

## 2012-09-01 MED ORDER — OXYCODONE-ACETAMINOPHEN 5-325 MG PO TABS: ORAL_TABLET | ORAL | Status: AC

## 2012-09-01 MED ORDER — PREDNISONE 20 MG PO TABS: 40.0000 mg | ORAL_TABLET | Freq: Every day | ORAL | Status: AC

## 2012-09-01 NOTE — ED Notes (Signed)
Pt recently dx and treated for Bronchitis. Finished Z pak on Monday.

## 2012-09-01 NOTE — ED Notes (Signed)
Pt reports constant left shoulder pain radiating into left chest x 2 days. Pain increases with movement of head and inspiration. Pt reports mild sob. No nausea.

## 2012-09-01 NOTE — ED Notes (Signed)
Patient has provided urine sample if needed.

## 2012-09-01 NOTE — ED Provider Notes (Signed)
History     CSN: [id number]  Arrival date & time 09/01/12  4098   First MD Initiated Contact with Patient 09/01/12 [phone]      Chief Complaint  Patient presents with  . Chest Pain  . Shoulder Pain  . Cough     HPI Pt was seen at 0740.  Per pt, c/o gradual onset and persistence of constant cough and wheezing for the past 1 to 2 weeks. Pt states her PMD rx zithromax for same, completed that rx 2d ago.  Pt states she has developed left posterior shoulder "pain" which radiates into her left anterior chest for the psat 2 days.  Pain worsens with palpation of the area and movement of her chest, neck and left arm. Denies abd pain, no neck pain, no N/V/D, no fevers, no rash, no focal motor weakness, no tingling/numbness in extremities.     Past Medical History  Diagnosis Date  . Angina     patient states she had a hx when younger  . Diverticulitis   . Asthma   . Hypothyroidism   . Anemia   . Anxiety   . Hypertension   . Headache   . Pneumonia   . H/O hiatal hernia   . Fibromyalgia     Past Surgical History  Procedure Date  . Cholecystectomy   . Appendectomy     History  Substance Use Topics  . Smoking status: Current Some Day Smoker -- 0.5 packs/day for 4 years    Types: Cigarettes  . Smokeless tobacco: Never Used  . Alcohol Use: No    Review of Systems ROS: Statement: All systems negative except as marked or noted in the HPI; Constitutional: Negative for fever and chills. ; ; Eyes: Negative for eye pain, redness and discharge. ; ; ENMT: Negative for ear pain, hoarseness, nasal congestion, sinus pressure and sore throat. ; ; Cardiovascular: Negative for palpitations, diaphoresis, dyspnea and peripheral edema. ; ; Respiratory: +cough, wheezing. Negative for stridor. ; ; Gastrointestinal: Negative for nausea, vomiting, diarrhea, abdominal pain, blood in stool, hematemesis, jaundice and rectal bleeding. . ; ; Genitourinary: Negative for dysuria, flank pain and hematuria. ; ;  Musculoskeletal: +back pain. Negative for neck pain. Negative for swelling and trauma.; ; Skin: Negative for pruritus, rash, abrasions, blisters, bruising and skin lesion.; ; Neuro: Negative for headache, lightheadedness and neck stiffness. Negative for weakness, altered level of consciousness , altered mental status, extremity weakness, paresthesias, involuntary movement, seizure and syncope.       Allergies  Shellfish allergy; Sulfa antibiotics; and Codeine  Home Medications   Current Outpatient Rx  Name Route Sig Dispense Refill  . ALBUTEROL SULFATE HFA 108 (90 BASE) MCG/ACT IN AERS Inhalation Inhale 2 puffs into the lungs every 6 (six) hours as needed. For shortness of breath away from home    . ALBUTEROL SULFATE (2.5 MG/3ML) 0.083% IN NEBU Nebulization Take 2.5 mg by nebulization 2 (two) times daily as needed. For shortness of breath    . ALPRAZOLAM 0.5 MG PO TABS Oral Take 0.5 mg by mouth 2 (two) times daily as needed. For anxiety    . HYDROCHLOROTHIAZIDE 25 MG PO TABS Oral Take 25 mg by mouth daily.    Marland Kitchen LEVOTHYROXINE SODIUM 75 MCG PO TABS Oral Take 75 mcg by mouth daily.    . OXYCODONE-ACETAMINOPHEN 5-325 MG PO TABS Oral Take 1 tablet by mouth every 8 (eight) hours as needed. For pain    . TOPIRAMATE 50 MG PO TABS Oral Take  50 mg by mouth 2 (two) times daily.      BP 136/92  Pulse 85  Temp 98.2 F (36.8 C) (Oral)  Resp 20  SpO2 99%  LMP 08/25/2012  Physical Exam 0745: Physical examination:  Nursing notes reviewed; Vital signs and O2 SAT reviewed;  Constitutional: Well developed, Well nourished, Well hydrated, In no acute distress; Head:  Normocephalic, atraumatic; Eyes: EOMI, PERRL, No scleral icterus; ENMT: Mouth and pharynx normal, Mucous membranes moist; Neck: Supple, Full range of motion, No lymphadenopathy; Cardiovascular: Regular rate and rhythm, No murmur, rub, or gallop; Respiratory: Breath sounds coarse & equal bilaterally, No rales, rhonchi, wheezes.  Speaking full  sentences with ease, Normal respiratory effort/excursion; Chest: +left upper chest wall tender to palp, no deformity, Movement normal; Abdomen: Soft, Nontender, Nondistended, Normal bowel sounds; Genitourinary: No CVA tenderness; Spine:  No midline CS, TS, LS tenderness.  +TTP left thoracic paraspinal and left trapezius muscles.;; Extremities: Pulses normal, No tenderness, No edema, No calf edema or asymmetry.; Neuro: AA&Ox3, Major CN grossly intact.  Speech clear. No gross focal motor or sensory deficits in extremities.; Skin: Color normal, Warm, Dry.   ED Course  Procedures    MDM  MDM Reviewed: nursing note, vitals and previous chart Reviewed previous: ECG Interpretation: ECG, labs and x-ray      Date: 09/01/2012  Rate: 80  Rhythm: normal sinus rhythm  QRS Axis: normal  Intervals: normal  ST/T Wave abnormalities: normal  Conduction Disutrbances:none  Narrative Interpretation:   Old EKG Reviewed: unchanged; no significant changes from previous EKG dated 04/24/2002.  Results for orders placed during the hospital encounter of 09/01/12  D-DIMER, QUANTITATIVE      Component Value Range   D-Dimer, Quant <0.27  0.00 - 0.48 ug/mL-FEU  POCT I-STAT, CHEM 8      Component Value Range   Sodium 141  135 - 145 mEq/L   Potassium 3.9  3.5 - 5.1 mEq/L   Chloride 106  96 - 112 mEq/L   BUN 8  6 - 23 mg/dL   Creatinine, Ser 6.21  0.50 - 1.10 mg/dL   Glucose, Bld 88  70 - 99 mg/dL   Calcium, Ion 3.08  6.57 - 1.23 mmol/L   TCO2 24  0 - 100 mmol/L   Hemoglobin 12.9  12.0 - 15.0 g/dL   HCT 84.6  96.2 - 95.2 %  POCT I-STAT TROPONIN I      Component Value Range   Troponin i, poc 0.00  0.00 - 0.08 ng/mL   Comment 3            Dg Chest 2 View 09/01/2012  *RADIOLOGY REPORT*  Clinical Data: Cough, worsening of left chest and shoulder pain  CHEST - 2 VIEW  Comparison: Chest x-ray of 01/28/2010  Findings: No active infiltrate or effusion is seen.  Mild peribronchial thickening is noted.  Mediastinal  contours appear normal.  The heart is within normal limits in size.  No bony abnormality is seen.  IMPRESSION: No pneumonia.  Mild peribronchial thickening.   Original Report Authenticated By: Juline Patch, M.D.     1000:  Feeling better after neb x1, lungs coarse with exp wheezes scattered bilat, resps without distress.  Will dose another neb and prednisone.  CP appears msk at this time.  Doubt PE with negative d-dimer, doubt ACS with normal troponin and EKG after constant symptoms for 2 days.   1045:  Feels better after nebs x2 and prednisone.  Lungs CTA bilat, no wheezing,  resps easy.  Pt has climbed off the stretcher on her own, gotten herself dressed and is sitting in a chair at the bedside requesting discharge and a work note.  Dx testing d/w pt.  Questions answered.  Verb understanding, agreeable to d/c home with outpt f/u.         Laray Anger, DO 09/03/12 1759

## 2012-11-27 ENCOUNTER — Emergency Department (HOSPITAL_COMMUNITY)
Admission: EM | Admit: 2012-11-27 | Discharge: 2012-11-27 | Disposition: A | Discharge status: Home / Self Care | Payer: BC Managed Care – PPO | Attending: Emergency Medicine | Admitting: Emergency Medicine

## 2012-11-27 ENCOUNTER — Encounter (HOSPITAL_COMMUNITY): Payer: Self-pay

## 2012-11-27 ENCOUNTER — Emergency Department (HOSPITAL_COMMUNITY): Payer: BC Managed Care – PPO

## 2012-11-27 DIAGNOSIS — R112 Nausea with vomiting, unspecified: Secondary | ICD-10-CM | POA: Insufficient documentation

## 2012-11-27 DIAGNOSIS — R350 Frequency of micturition: Secondary | ICD-10-CM | POA: Insufficient documentation

## 2012-11-27 DIAGNOSIS — E039 Hypothyroidism, unspecified: Secondary | ICD-10-CM | POA: Insufficient documentation

## 2012-11-27 DIAGNOSIS — Z79899 Other long term (current) drug therapy: Secondary | ICD-10-CM | POA: Insufficient documentation

## 2012-11-27 DIAGNOSIS — N83209 Unspecified ovarian cyst, unspecified side: Secondary | ICD-10-CM | POA: Insufficient documentation

## 2012-11-27 DIAGNOSIS — I10 Essential (primary) hypertension: Secondary | ICD-10-CM | POA: Insufficient documentation

## 2012-11-27 DIAGNOSIS — Z862 Personal history of diseases of the blood and blood-forming organs and certain disorders involving the immune mechanism: Secondary | ICD-10-CM | POA: Insufficient documentation

## 2012-11-27 DIAGNOSIS — IMO0001 Reserved for inherently not codable concepts without codable children: Secondary | ICD-10-CM | POA: Insufficient documentation

## 2012-11-27 DIAGNOSIS — F17200 Nicotine dependence, unspecified, uncomplicated: Secondary | ICD-10-CM | POA: Insufficient documentation

## 2012-11-27 DIAGNOSIS — J45909 Unspecified asthma, uncomplicated: Secondary | ICD-10-CM | POA: Insufficient documentation

## 2012-11-27 DIAGNOSIS — Z8719 Personal history of other diseases of the digestive system: Secondary | ICD-10-CM | POA: Insufficient documentation

## 2012-11-27 DIAGNOSIS — N83299 Other ovarian cyst, unspecified side: Secondary | ICD-10-CM

## 2012-11-27 LAB — COMPREHENSIVE METABOLIC PANEL
BUN: 11 mg/dL (ref 6–23)
CO2: 26 mEq/L (ref 19–32)
Calcium: 9.1 mg/dL (ref 8.4–10.5)
Chloride: 102 mEq/L (ref 96–112)
Creatinine, Ser: 0.66 mg/dL (ref 0.50–1.10)
GFR calc non Af Amer: >90 mL/min (ref >90)
Total Bilirubin: 0.4 mg/dL (ref 0.3–1.2)

## 2012-11-27 LAB — CBC WITH DIFFERENTIAL/PLATELET
Basophils Relative: 0 % (ref 0–1)
Eosinophils Relative: 1 % (ref 0–5)
HCT: 37.4 % (ref 36.0–46.0)
Hemoglobin: 13.4 g/dL (ref 12.0–15.0)
Lymphocytes Relative: 25 % (ref 12–46)
MCHC: 35.8 g/dL (ref 30.0–36.0)
MCV: 83.1 fL (ref 78.0–100.0)
Monocytes Absolute: 1 10*3/uL (ref 0.1–1.0)
Monocytes Relative: 7 % (ref 3–12)
Neutro Abs: 10.6 10*3/uL — ABNORMAL HIGH (ref 1.7–7.7)

## 2012-11-27 LAB — URINALYSIS, ROUTINE W REFLEX MICROSCOPIC
Bilirubin Urine: NEGATIVE
Glucose, UA: NEGATIVE mg/dL
Hgb urine dipstick: NEGATIVE
Ketones, ur: NEGATIVE mg/dL
Leukocytes, UA: NEGATIVE
Protein, ur: NEGATIVE mg/dL
pH: 6.5 (ref 5.0–8.0)

## 2012-11-27 LAB — POCT PREGNANCY, URINE: Preg Test, Ur: NEGATIVE

## 2012-11-27 MED ORDER — ONDANSETRON HCL 4 MG/2ML IJ SOLN
4.0000 mg | Freq: Once | INTRAMUSCULAR | Status: AC
  Administered 2012-11-27: 4 mg via INTRAVENOUS
  Filled 2012-11-27: qty 2

## 2012-11-27 MED ORDER — OXYCODONE-ACETAMINOPHEN 5-325 MG PO TABS: 1.0000 | ORAL_TABLET | Freq: Three times a day (TID) | ORAL | Status: DC | PRN

## 2012-11-27 MED ORDER — HYDROMORPHONE HCL PF 1 MG/ML IJ SOLN
1.0000 mg | Freq: Once | INTRAMUSCULAR | Status: AC
  Administered 2012-11-27: 1 mg via INTRAVENOUS
  Filled 2012-11-27: qty 1

## 2012-11-27 MED ORDER — SODIUM CHLORIDE 0.9 % IV SOLN
Freq: Once | INTRAVENOUS | Status: AC
  Administered 2012-11-27: 15:00:00 via INTRAVENOUS

## 2012-11-27 NOTE — ED Notes (Signed)
Patient reports that she has been having left lower abdominal pain, vomiting, and urinary frequency x 2 weeks.

## 2012-11-27 NOTE — ED Provider Notes (Signed)
History     CSN: [id number]  Arrival date & time 11/27/12  1225   First MD Initiated Contact with Patient 11/27/12 1333      Chief Complaint  Patient presents with  . Abdominal Pain  . Emesis    (Consider location/radiation/quality/duration/timing/severity/associated sxs/prior treatment) HPI the patient presents with concerns of left inguinal pain, nausea, vomiting, urinary frequency.  Symptoms began several weeks ago, since onset has been persistent.  She notes symptoms are most pronounced in the last few days, improved today.  However, she continues to complain of pain in the left inguinal crease, sharp, worse with motion or palpation.  She notes concurrent nausea, anorexia.  She vomited yesterday.  She denies diarrhea.  She denies vaginal discharge, but states that she has urinary frequency.  She denies dysuria.  No fever, no chills  Past Medical History  Diagnosis Date  . Diverticulitis   . Asthma   . Hypothyroidism   . Anemia   . Anxiety   . Hypertension   . Headache   . Pneumonia   . H/O hiatal hernia   . Fibromyalgia     Past Surgical History  Procedure Date  . Cholecystectomy   . Appendectomy     No family history on file.  History  Substance Use Topics  . Smoking status: Current Some Day Smoker -- 0.5 packs/day for 4 years    Types: Cigarettes  . Smokeless tobacco: Never Used  . Alcohol Use: Yes     Comment: occasionally    OB History    Grav Para Term Preterm Abortions TAB SAB Ect Mult Living                  Review of Systems  Constitutional:       Per HPI, otherwise negative  HENT:       Per HPI, otherwise negative  Eyes: Negative.   Respiratory:       Per HPI, otherwise negative  Cardiovascular:       Per HPI, otherwise negative  Gastrointestinal: Negative for vomiting.  Genitourinary:       Hpi  LMP 2wk pta  Musculoskeletal:       Per HPI, otherwise negative  Skin: Negative.   Neurological: Negative for syncope.    Allergies   Shellfish allergy; Sulfa antibiotics; and Codeine  Home Medications   Current Outpatient Rx  Name  Route  Sig  Dispense  Refill  . ALBUTEROL SULFATE HFA 108 (90 BASE) MCG/ACT IN AERS   Inhalation   Inhale 2 puffs into the lungs every 6 (six) hours as needed. For shortness of breath away from home         . ALBUTEROL SULFATE (2.5 MG/3ML) 0.083% IN NEBU   Nebulization   Take 2.5 mg by nebulization 2 (two) times daily as needed. For shortness of breath         . ALPRAZOLAM 0.5 MG PO TABS   Oral   Take 0.5 mg by mouth 2 (two) times daily as needed. For anxiety         . HYDROCHLOROTHIAZIDE 25 MG PO TABS   Oral   Take 25 mg by mouth daily.         Marland Kitchen LEVOTHYROXINE SODIUM 75 MCG PO TABS   Oral   Take 75 mcg by mouth daily.         . OXYCODONE-ACETAMINOPHEN 5-325 MG PO TABS   Oral   Take 1 tablet by mouth every 8 (eight) hours  as needed. For pain           BP 156/90  Pulse 71  Temp 98.5 F (36.9 C) (Oral)  Resp 12  SpO2 97%  LMP 11/08/2012  Physical Exam  Nursing note and vitals reviewed. Constitutional: She is oriented to person, place, and time. She appears well-developed and well-nourished. No distress.  HENT:  Head: Normocephalic and atraumatic.  Eyes: Conjunctivae normal and EOM are normal.  Cardiovascular: Normal rate and regular rhythm.   Pulmonary/Chest: Effort normal and breath sounds normal. No stridor. No respiratory distress.  Abdominal: Soft. Normal appearance. She exhibits no distension. There is tenderness in the left lower quadrant. There is no rigidity, no rebound and no guarding.  Musculoskeletal: She exhibits no edema.  Neurological: She is alert and oriented to person, place, and time. No cranial nerve deficit.  Skin: Skin is warm and dry.  Psychiatric: She has a normal mood and affect.    ED Course  Procedures (including critical care time)   Labs Reviewed  URINALYSIS, ROUTINE W REFLEX MICROSCOPIC  CBC WITH DIFFERENTIAL   COMPREHENSIVE METABOLIC PANEL   No results found.   No diagnosis found.  A review of the patient's chart demonstrates prior diverticulitis,    Uptake: I discussed the patient's CT scan with our radiologist.  There is evidence of a new cystic mass in the patient's left adnexa.  Given her pain, ultrasound was ordered.  Uptake: Ultrasound demonstrates a complex cyst, new in the past 10 months on the left.  No evidence of torsion.  MDM  This female presents with new left lower quadrant abdominal pain.  On exam she is uncomfortable, but afebrile, not tachycardic, and in no distress.  The patient's tenderness to palpation in this area.  The pain, her urinary complaints CT scan was performed.  This, and the subsequent ultrasound are consistent with a new cystic lesion.  Absent fever, distress, the patient was [facility].  Gerhard Munch, MD 11/27/12 [phone]

## 2012-12-05 ENCOUNTER — Other Ambulatory Visit: Payer: Self-pay | Admitting: Obstetrics and Gynecology

## 2012-12-06 ENCOUNTER — Encounter (HOSPITAL_COMMUNITY)
Admission: RE | Admit: 2012-12-06 | Discharge: 2012-12-06 | Disposition: A | Discharge status: Home / Self Care | Payer: BC Managed Care – PPO | Source: Ambulatory Visit | Attending: Obstetrics and Gynecology | Admitting: Obstetrics and Gynecology

## 2012-12-06 ENCOUNTER — Encounter (HOSPITAL_COMMUNITY): Payer: Self-pay | Admitting: Pharmacist

## 2012-12-06 ENCOUNTER — Encounter (HOSPITAL_COMMUNITY): Payer: Self-pay

## 2012-12-06 HISTORY — DX: Cardiac murmur, unspecified: R01.1

## 2012-12-06 LAB — SURGICAL PCR SCREEN
MRSA, PCR: NEGATIVE
Staphylococcus aureus: NEGATIVE

## 2012-12-06 LAB — BASIC METABOLIC PANEL
Chloride: 100 mEq/L (ref 96–112)
GFR calc Af Amer: >90 mL/min (ref >90)
Potassium: 3.8 mEq/L (ref 3.5–5.1)
Sodium: 136 mEq/L (ref 135–145)

## 2012-12-06 LAB — CBC
Platelets: 284 10*3/uL (ref 150–400)
RDW: 13.8 % (ref 11.5–15.5)
WBC: 16.8 10*3/uL — ABNORMAL HIGH (ref 4.0–10.5)

## 2012-12-06 NOTE — Pre-Procedure Instructions (Signed)
BP's of 186/113 166/118 182/105 given to Dr Malen Gauze and Dr Arby Barrette, patient to see PCP ASAP for BP control Dr Billy Coast notified and agrees. Appt made for pt tomorrow.

## 2012-12-06 NOTE — Patient Instructions (Addendum)
19 Allison Martinez  12/06/2012   Your procedure is scheduled on:  12/19  Enter through the Main Entrance of [facility] at [facility]   Pick up the phone at the desk and dial [phone].   Call this number if you have problems the morning of surgery: [phone]   Remember:   Do not eat food:After Midnight.  Do not drink clear liquids: 4 Hours before arrival.  Take these medicines the morning of surgery with A SIP OF WATER: Blood pressure medication, bring inhaler, take Thyroid medicaion and may take Xanax.   Do not wear jewelry, make-up or nail polish.  Do not wear lotions, powders, or perfumes. You may wear deodorant.  Do not shave 48 hours prior to surgery.  Do not bring valuables to the hospital.  Contacts, dentures or bridgework may not be worn into surgery.  Leave suitcase in the car. After surgery it may be brought to your room.  For patients admitted to the hospital, checkout time is 11:00 AM the day of discharge.   Patients discharged the day of surgery will not be allowed to drive home.  Name and phone number of your driver: undecided  Special Instructions: Shower using CHG 2 nights before surgery and the night before surgery.  If you shower the day of surgery use CHG.  Use special wash - you have one bottle of CHG for all showers.  You should use approximately 1/3 of the bottle for each shower.   Please read over the following fact sheets that you were given: MRSA Information

## 2012-12-07 ENCOUNTER — Ambulatory Visit (HOSPITAL_COMMUNITY)
Admission: RE | Admit: 2012-12-07 | Payer: BC Managed Care – PPO | Source: Ambulatory Visit | Admitting: Obstetrics and Gynecology

## 2012-12-07 ENCOUNTER — Encounter (HOSPITAL_COMMUNITY): Admission: RE | Payer: Self-pay | Source: Ambulatory Visit

## 2012-12-07 ENCOUNTER — Encounter (HOSPITAL_COMMUNITY): Payer: Self-pay | Admitting: Anesthesiology

## 2012-12-07 SURGERY — LAPAROSCOPY OPERATIVE
Anesthesia: General

## 2012-12-07 MED ORDER — CEFAZOLIN SODIUM-DEXTROSE 2-3 GM-% IV SOLR: 2.0000 g | INTRAVENOUS | Status: DC

## 2012-12-15 ENCOUNTER — Encounter (HOSPITAL_COMMUNITY): Payer: Self-pay | Admitting: Anesthesiology

## 2012-12-15 ENCOUNTER — Encounter (HOSPITAL_COMMUNITY)
Admission: RE | Disposition: A | Discharge status: Home / Self Care | Payer: Self-pay | Source: Ambulatory Visit | Attending: Obstetrics and Gynecology

## 2012-12-15 ENCOUNTER — Ambulatory Visit (HOSPITAL_COMMUNITY): Payer: BC Managed Care – PPO | Admitting: Anesthesiology

## 2012-12-15 ENCOUNTER — Ambulatory Visit (HOSPITAL_COMMUNITY)
Admission: RE | Admit: 2012-12-15 | Discharge: 2012-12-15 | Disposition: A | Discharge status: Home / Self Care | Payer: BC Managed Care – PPO | Source: Ambulatory Visit | Attending: Obstetrics and Gynecology | Admitting: Obstetrics and Gynecology

## 2012-12-15 DIAGNOSIS — D252 Subserosal leiomyoma of uterus: Secondary | ICD-10-CM | POA: Insufficient documentation

## 2012-12-15 DIAGNOSIS — Z01812 Encounter for preprocedural laboratory examination: Secondary | ICD-10-CM | POA: Insufficient documentation

## 2012-12-15 DIAGNOSIS — N8300 Follicular cyst of ovary, unspecified side: Secondary | ICD-10-CM | POA: Insufficient documentation

## 2012-12-15 DIAGNOSIS — Z01818 Encounter for other preprocedural examination: Secondary | ICD-10-CM | POA: Insufficient documentation

## 2012-12-15 DIAGNOSIS — I10 Essential (primary) hypertension: Secondary | ICD-10-CM | POA: Insufficient documentation

## 2012-12-15 DIAGNOSIS — N949 Unspecified condition associated with female genital organs and menstrual cycle: Secondary | ICD-10-CM | POA: Insufficient documentation

## 2012-12-15 DIAGNOSIS — N803 Endometriosis of pelvic peritoneum, unspecified: Secondary | ICD-10-CM | POA: Insufficient documentation

## 2012-12-15 DIAGNOSIS — N83209 Unspecified ovarian cyst, unspecified side: Secondary | ICD-10-CM | POA: Insufficient documentation

## 2012-12-15 HISTORY — PX: LAPAROSCOPY: SHX197

## 2012-12-15 LAB — HCG, SERUM, QUALITATIVE: Preg, Serum: NEGATIVE

## 2012-12-15 SURGERY — LAPAROSCOPY, DIAGNOSTIC
Anesthesia: General | Site: Abdomen | Wound class: Clean

## 2012-12-15 MED ORDER — ONDANSETRON HCL 4 MG/2ML IJ SOLN
INTRAMUSCULAR | Status: DC | PRN
  Administered 2012-12-15: 4 mg via INTRAVENOUS

## 2012-12-15 MED ORDER — ALBUTEROL SULFATE (5 MG/ML) 0.5% IN NEBU
2.5000 mg | INHALATION_SOLUTION | Freq: Once | RESPIRATORY_TRACT | Status: DC
  Filled 2012-12-15: qty 0.5

## 2012-12-15 MED ORDER — FENTANYL CITRATE 0.05 MG/ML IJ SOLN
INTRAMUSCULAR | Status: DC | PRN
  Administered 2012-12-15: 100 ug via INTRAVENOUS
  Administered 2012-12-15: 50 ug via INTRAVENOUS
  Administered 2012-12-15: 100 ug via INTRAVENOUS

## 2012-12-15 MED ORDER — KETOROLAC TROMETHAMINE 30 MG/ML IJ SOLN: 15.0000 mg | Freq: Once | INTRAMUSCULAR | Status: DC | PRN

## 2012-12-15 MED ORDER — EPHEDRINE SULFATE 50 MG/ML IJ SOLN
INTRAMUSCULAR | Status: DC | PRN
  Administered 2012-12-15: 5 mg via INTRAVENOUS

## 2012-12-15 MED ORDER — GLYCOPYRROLATE 0.2 MG/ML IJ SOLN
INTRAMUSCULAR | Status: DC | PRN
  Administered 2012-12-15 (×2): 0.2 mg via INTRAVENOUS
  Administered 2012-12-15: 0.6 mg via INTRAVENOUS

## 2012-12-15 MED ORDER — ACETAMINOPHEN 10 MG/ML IV SOLN
1000.0000 mg | Freq: Four times a day (QID) | INTRAVENOUS | Status: DC
  Administered 2012-12-15: 1000 mg via INTRAVENOUS

## 2012-12-15 MED ORDER — MEPERIDINE HCL 25 MG/ML IJ SOLN: 6.2500 mg | INTRAMUSCULAR | Status: DC | PRN

## 2012-12-15 MED ORDER — ROCURONIUM BROMIDE 100 MG/10ML IV SOLN
INTRAVENOUS | Status: DC | PRN
  Administered 2012-12-15: 20 mg via INTRAVENOUS
  Administered 2012-12-15: 50 mg via INTRAVENOUS

## 2012-12-15 MED ORDER — ACETAMINOPHEN 10 MG/ML IV SOLN
INTRAVENOUS | Status: AC
  Filled 2012-12-15: qty 100

## 2012-12-15 MED ORDER — FENTANYL CITRATE 0.05 MG/ML IJ SOLN
INTRAMUSCULAR | Status: AC
  Administered 2012-12-15: 50 ug via INTRAVENOUS
  Filled 2012-12-15: qty 2

## 2012-12-15 MED ORDER — BUPIVACAINE HCL (PF) 0.25 % IJ SOLN
INTRAMUSCULAR | Status: AC
  Filled 2012-12-15: qty 30

## 2012-12-15 MED ORDER — GLYCOPYRROLATE 0.2 MG/ML IJ SOLN
INTRAMUSCULAR | Status: AC
  Filled 2012-12-15: qty 2

## 2012-12-15 MED ORDER — OXYCODONE-ACETAMINOPHEN 5-325 MG PO TABS: 1.0000 | ORAL_TABLET | ORAL | Status: DC | PRN

## 2012-12-15 MED ORDER — ALBUTEROL SULFATE HFA 108 (90 BASE) MCG/ACT IN AERS
INHALATION_SPRAY | RESPIRATORY_TRACT | Status: DC | PRN
  Administered 2012-12-15 (×3): 2 via RESPIRATORY_TRACT

## 2012-12-15 MED ORDER — MIDAZOLAM HCL 5 MG/5ML IJ SOLN
INTRAMUSCULAR | Status: DC | PRN
  Administered 2012-12-15: 2 mg via INTRAVENOUS

## 2012-12-15 MED ORDER — ONDANSETRON HCL 4 MG/2ML IJ SOLN: 4.0000 mg | Freq: Once | INTRAMUSCULAR | Status: DC | PRN

## 2012-12-15 MED ORDER — MIDAZOLAM HCL 2 MG/2ML IJ SOLN
INTRAMUSCULAR | Status: AC
  Filled 2012-12-15: qty 2

## 2012-12-15 MED ORDER — LACTATED RINGERS IV SOLN
INTRAVENOUS | Status: DC
  Administered 2012-12-15: 13:00:00 via INTRAVENOUS

## 2012-12-15 MED ORDER — FENTANYL CITRATE 0.05 MG/ML IJ SOLN
INTRAMUSCULAR | Status: AC
  Filled 2012-12-15: qty 2

## 2012-12-15 MED ORDER — ALBUTEROL SULFATE HFA 108 (90 BASE) MCG/ACT IN AERS
INHALATION_SPRAY | RESPIRATORY_TRACT | Status: AC
  Filled 2012-12-15: qty 6.7

## 2012-12-15 MED ORDER — LIDOCAINE HCL (CARDIAC) 20 MG/ML IV SOLN
INTRAVENOUS | Status: DC | PRN
  Administered 2012-12-15: 50 mg via INTRAVENOUS

## 2012-12-15 MED ORDER — OXYCODONE-ACETAMINOPHEN 5-325 MG PO TABS
ORAL_TABLET | ORAL | Status: AC
  Filled 2012-12-15: qty 1

## 2012-12-15 MED ORDER — NEOSTIGMINE METHYLSULFATE 1 MG/ML IJ SOLN
INTRAMUSCULAR | Status: DC | PRN
  Administered 2012-12-15: 3 mg via INTRAVENOUS
  Administered 2012-12-15: 1 mg via INTRAVENOUS

## 2012-12-15 MED ORDER — FENTANYL CITRATE 0.05 MG/ML IJ SOLN
25.0000 ug | INTRAMUSCULAR | Status: DC | PRN
  Administered 2012-12-15 (×4): 50 ug via INTRAVENOUS

## 2012-12-15 MED ORDER — BUPIVACAINE HCL (PF) 0.25 % IJ SOLN
INTRAMUSCULAR | Status: DC | PRN
  Administered 2012-12-15: 3 mL

## 2012-12-15 MED ORDER — PROPOFOL 10 MG/ML IV EMUL
INTRAVENOUS | Status: DC | PRN
  Administered 2012-12-15: 200 mg via INTRAVENOUS

## 2012-12-15 MED ORDER — DEXAMETHASONE SODIUM PHOSPHATE 4 MG/ML IJ SOLN
INTRAMUSCULAR | Status: DC | PRN
  Administered 2012-12-15: 10 mg via INTRAVENOUS

## 2012-12-15 MED ORDER — LACTATED RINGERS IR SOLN
Status: DC | PRN
  Administered 2012-12-15: 3000 mL

## 2012-12-15 MED ORDER — LACTATED RINGERS IV SOLN: INTRAVENOUS | Status: DC

## 2012-12-15 MED ORDER — OXYCODONE-ACETAMINOPHEN 5-325 MG PO TABS
1.0000 | ORAL_TABLET | Freq: Once | ORAL | Status: AC
  Administered 2012-12-15: 1 via ORAL

## 2012-12-15 MED ORDER — FENTANYL CITRATE 0.05 MG/ML IJ SOLN
INTRAMUSCULAR | Status: AC
Start: 2012-12-15 — End: 2012-12-15
  Filled 2012-12-15: qty 5

## 2012-12-15 SURGICAL SUPPLY — 80 items
ADH SKN CLS APL DERMABOND .7 (GAUZE/BANDAGES/DRESSINGS) ×3
BAG SPEC RTRVL LRG 6X4 10 (ENDOMECHANICALS)
BARRIER ADHS 3X4 INTERCEED (GAUZE/BANDAGES/DRESSINGS) ×1 IMPLANT
BRR ADH 4X3 ABS CNTRL BYND (GAUZE/BANDAGES/DRESSINGS)
CABLE HIGH FREQUENCY MONO STRZ (ELECTRODE) ×1 IMPLANT
CATH ROBINSON RED A/P 16FR (CATHETERS) IMPLANT
CLOTH BEACON ORANGE TIMEOUT ST (SAFETY) ×4 IMPLANT
CONT PATH 16OZ SNAP LID 3702 (MISCELLANEOUS) ×4 IMPLANT
COVER MAYO STAND STRL (DRAPES) ×4 IMPLANT
COVER TABLE BACK 60X90 (DRAPES) ×8 IMPLANT
COVER TIP SHEARS 8 DVNC (MISCELLANEOUS) ×3 IMPLANT
COVER TIP SHEARS 8MM DA VINCI (MISCELLANEOUS) ×1
DECANTER SPIKE VIAL GLASS SM (MISCELLANEOUS) ×1 IMPLANT
DERMABOND ADVANCED (GAUZE/BANDAGES/DRESSINGS) ×1
DERMABOND ADVANCED .7 DNX12 (GAUZE/BANDAGES/DRESSINGS) ×3 IMPLANT
DRAPE HUG U DISPOSABLE (DRAPE) ×4 IMPLANT
DRAPE LG THREE QUARTER DISP (DRAPES) ×8 IMPLANT
DRAPE ROBOTICS STRL (DRAPES) ×3 IMPLANT
DRAPE WARM FLUID 44X44 (DRAPE) ×4 IMPLANT
ELECT REM PT RETURN 9FT ADLT (ELECTROSURGICAL) ×4
ELECTRODE REM PT RTRN 9FT ADLT (ELECTROSURGICAL) ×3 IMPLANT
EVACUATOR SMOKE 8.L (FILTER) ×4 IMPLANT
FORCEPS CUTTING 33CM 5MM (CUTTING FORCEPS) IMPLANT
FORCEPS CUTTING 45CM 5MM (CUTTING FORCEPS) IMPLANT
GAUZE VASELINE 3X9 (GAUZE/BANDAGES/DRESSINGS) ×3 IMPLANT
GLOVE BIO SURGEON STRL SZ7.5 (GLOVE) ×8 IMPLANT
GOWN PREVENTION PLUS LG XLONG (DISPOSABLE) ×8 IMPLANT
GOWN PREVENTION PLUS XLARGE (GOWN DISPOSABLE) ×1 IMPLANT
GOWN STRL REIN XL XLG (GOWN DISPOSABLE) ×24 IMPLANT
GYRUS RUMI II 2.5CM BLUE (DISPOSABLE)
GYRUS RUMI II 3.5CM BLUE (DISPOSABLE)
GYRUS RUMI II 4.0CM BLUE (DISPOSABLE)
KIT ACCESSORY DA VINCI DISP (KITS) ×1
KIT ACCESSORY DVNC DISP (KITS) ×3 IMPLANT
LEGGING LITHOTOMY PAIR STRL (DRAPES) ×3 IMPLANT
NDL INSUFFLATION 14GA 120MM (NEEDLE) ×1 IMPLANT
NEEDLE INSUFFLATION 14GA 120MM (NEEDLE) ×4 IMPLANT
NS IRRIG 1000ML POUR BTL (IV SOLUTION) ×3 IMPLANT
OCCLUDER COLPOPNEUMO (BALLOONS) IMPLANT
PACK LAPAROSCOPY BASIN (CUSTOM PROCEDURE TRAY) ×4 IMPLANT
PACK LAVH (CUSTOM PROCEDURE TRAY) ×4 IMPLANT
PAD PREP 24X48 CUFFED NSTRL (MISCELLANEOUS) ×8 IMPLANT
POUCH SPECIMEN RETRIEVAL 10MM (ENDOMECHANICALS) IMPLANT
PROTECTOR NERVE ULNAR (MISCELLANEOUS) ×8 IMPLANT
RUMI II 3.0CM BLUE KOH-EFFICIE (DISPOSABLE) IMPLANT
RUMI II GYRUS 2.5CM BLUE (DISPOSABLE) IMPLANT
RUMI II GYRUS 3.5CM BLUE (DISPOSABLE) IMPLANT
RUMI II GYRUS 4.0CM BLUE (DISPOSABLE) IMPLANT
SET CYSTO W/LG BORE CLAMP LF (SET/KITS/TRAYS/PACK) IMPLANT
SET IRRIG TUBING LAPAROSCOPIC (IRRIGATION / IRRIGATOR) ×4 IMPLANT
SLEEVE Z-THREAD 5X100MM (TROCAR) IMPLANT
SOLUTION ELECTROLUBE (MISCELLANEOUS) ×4 IMPLANT
SUT VIC AB 0 CT1 27 (SUTURE)
SUT VIC AB 0 CT1 27XBRD ANTBC (SUTURE) IMPLANT
SUT VIC AB 3-0 SH 27 (SUTURE) ×4
SUT VIC AB 3-0 SH 27X BRD (SUTURE) ×3 IMPLANT
SUT VICRYL 0 UR6 27IN ABS (SUTURE) ×7 IMPLANT
SUT VICRYL 4-0 PS2 18IN ABS (SUTURE) ×8 IMPLANT
SUT VICRYL RAPIDE 4/0 PS 2 (SUTURE) ×6 IMPLANT
SUT VLOC 180 3-0 9IN GS21 (SUTURE) IMPLANT
SYR 50ML LL SCALE MARK (SYRINGE) ×5 IMPLANT
SYRINGE 10CC LL (SYRINGE) ×4 IMPLANT
SYSTEM CONVERTIBLE TROCAR (TROCAR) ×3 IMPLANT
TIP UTERINE 5.1X6CM LAV DISP (MISCELLANEOUS) IMPLANT
TIP UTERINE 6.7X10CM GRN DISP (MISCELLANEOUS) IMPLANT
TIP UTERINE 6.7X6CM WHT DISP (MISCELLANEOUS) ×3 IMPLANT
TIP UTERINE 6.7X8CM BLUE DISP (MISCELLANEOUS) IMPLANT
TOWEL OR 17X24 6PK STRL BLUE (TOWEL DISPOSABLE) ×12 IMPLANT
TRAY FOLEY BAG SILVER LF 14FR (CATHETERS) ×4 IMPLANT
TRAY FOLEY CATH 14FR (SET/KITS/TRAYS/PACK) ×4 IMPLANT
TROCAR DISP BLADELESS 8 DVNC (TROCAR) ×3 IMPLANT
TROCAR DISP BLADELESS 8MM (TROCAR) ×1
TROCAR XCEL 12X100 BLDLESS (ENDOMECHANICALS) IMPLANT
TROCAR XCEL NON-BLD 5MMX100MML (ENDOMECHANICALS) ×1 IMPLANT
TROCAR Z-THREAD 12X150 (TROCAR) ×4 IMPLANT
TROCAR Z-THREAD BLADED 11X100M (TROCAR) ×1 IMPLANT
TROCAR Z-THREAD BLADED 5X100MM (TROCAR) IMPLANT
TUBING FILTER THERMOFLATOR (ELECTROSURGICAL) ×4 IMPLANT
WARMER LAPAROSCOPE (MISCELLANEOUS) ×4 IMPLANT
WATER STERILE IRR 1000ML POUR (IV SOLUTION) ×10 IMPLANT

## 2012-12-15 NOTE — H&P (Signed)
Allison Martinez, Allison Martinez                ACCOUNT NO.:  [id number]  MEDICAL RECORD NO.:  [id number]  LOCATION:  [facility]                         FACILITY:  [facility]  PHYSICIAN:  Lenoard Aden, M.D.DATE OF BIRTH:  03-11-1976  DATE OF ADMISSION:  12/12/2012 DATE OF DISCHARGE:                             HISTORY & PHYSICAL   CHIEF COMPLAINT:  Pelvic pain and ovarian cyst.  HISTORY:  She is a 36 year old white female, G0, with a history of worsening pelvic pain in the last 2-3 weeks in the emergency room. Ultrasound consistent with 10 cm left ovarian cyst for surgical intervention.  She is taking beta-blocker for hypertension and thyroid.  MEDICATIONS:  ProAir HFA inhaler, Percocet p.r.n., Xanax p.r.n.  SURGICAL HISTORY:  She has a no pregnancy history.  She has a history of cholecystectomy and appendectomy.  ALLERGIES:  Codeine derivatives.  FAMILY HISTORY:  She has family history remarkable for hypertension, diabetes, heart disease, and lung cancer.  PHYSICAL EXAMINATION:  GENERAL:  A well-developed, well-nourished, white female, in no acute distress. HEENT:  Normal. NECK:  Supple.  Full range of motion. LUNGS:  Clear. HEART:  Regular rhythm. ABDOMEN:  Soft, tender to deep palpation. Distinct left adnexal fullness and tenderness. EXTREMITIES:  There are no cords. NEUROLOGIC:  Nonfocal.  Neurologic exam is intact.  IMPRESSION:  Symptomatic left ovarian cyst, 10-cm cyst with persistent pain noted.  PLAN:  Proceed with diagnostic laparoscopy, possible da Vinci assisted laparoscopic ovarian cystectomy, and possible left salpingo- oophorectomy.  Risks of anesthesia, infection, bleeding, injury to abdominal organs, need for repair was discussed, delayed versus immediate complications to include bowel and bladder injury noted.  The patient acknowledges and wishes to proceed.     Lenoard Aden, M.D.     RJT/MEDQ  D:  12/14/2012  T:  12/15/2012  Job:  [id number]

## 2012-12-15 NOTE — Transfer of Care (Signed)
Immediate Anesthesia Transfer of Care Note  Patient: Allison Martinez  Procedure(s) Performed: Procedure(s) (LRB) with comments: LAPAROSCOPY DIAGNOSTIC (N/A)  Patient Location: PACU  Anesthesia Type:General  Level of Consciousness: awake, alert  and oriented  Airway & Oxygen Therapy: Patient Spontanous Breathing and Patient connected to nasal cannula oxygen  Post-op Assessment: Report given to PACU RN and Post -op Vital signs reviewed and stable  Post vital signs: Reviewed and stable  Complications: No apparent anesthesia complications

## 2012-12-15 NOTE — Anesthesia Preprocedure Evaluation (Signed)
Anesthesia Evaluation  Patient identified by MRN, date of birth, ID band Patient awake    Reviewed: Allergy & Precautions, H&P , NPO status , Patient's Chart, lab work & pertinent test results, reviewed documented beta blocker date and time   Airway Mallampati: II TM Distance: >3 FB Neck ROM: full    Dental No notable dental hx. (+) Teeth Intact   Pulmonary asthma ,  Will use Albuterol prior to OR breath sounds clear to auscultation        Cardiovascular hypertension, Pt. on home beta blockers + Valvular Problems/Murmurs + Systolic murmurs    Neuro/Psych PSYCHIATRIC DISORDERS Anxiety negative psych ROS   GI/Hepatic Neg liver ROS,   Endo/Other  Hypothyroidism Morbid obesity  Renal/GU negative Renal ROS     Musculoskeletal   Abdominal (+) + obese,   Peds  Hematology negative hematology ROS (+)   Anesthesia Other Findings   Reproductive/Obstetrics negative OB ROS                           Anesthesia Physical Anesthesia Plan  ASA: III  Anesthesia Plan: General   Post-op Pain Management:    Induction: Intravenous  Airway Management Planned: Oral ETT  Additional Equipment:   Intra-op Plan:   Post-operative Plan: Extubation in OR  Informed Consent: I have reviewed the patients History and Physical, chart, labs and discussed the procedure including the risks, benefits and alternatives for the proposed anesthesia with the patient or authorized representative who has indicated his/her understanding and acceptance.   Dental Advisory Given  Plan Discussed with: CRNA and Surgeon  Anesthesia Plan Comments:         Anesthesia Quick Evaluation

## 2012-12-15 NOTE — Progress Notes (Signed)
Patient ID: Allison Martinez, female   DOB: 1976-04-13, 36 y.o.   MRN: [id number] Patient seen and examined. Consent witnessed and signed. No changes noted. Update completed.

## 2012-12-15 NOTE — Op Note (Signed)
12/15/2012  3:15 PM  PATIENT:  Allison Martinez  36 y.o. female  PRE-OPERATIVE DIAGNOSIS:  LEFT OVARIAN CYST  POST-OPERATIVE DIAGNOSIS:  RUPTURED LEFT OVARIAN CYST WITH RESOLUTION RIGHT OVARIAN FOLLICULAR CYST MILD PELVIC ENDOMETRIOSIS UTERINE FIBROID  PROCEDURE:  Procedure(s): LAPAROSCOPY DIAGNOSTIC RIGHT OVARIAN CYSTOTOMY SURGEON:  Surgeon(s): Lenoard Aden, MD Alphonsus Sias. Ernestina Penna, MD  ASSISTANTSErnestina Penna   ANESTHESIA:   local and general  ESTIMATED BLOOD LOSS: MINIMAL  DRAINS: Urinary Catheter (Foley)   LOCAL MEDICATIONS USED:  MARCAINE     SPECIMEN:  No Specimen  DISPOSITION OF SPECIMEN:  N/A  COUNTS:  YES  DICTATION #: [id number]  PLAN OF CARE: DC HOME  PATIENT DISPOSITION:  PACU - hemodynamically stable.

## 2012-12-15 NOTE — Anesthesia Postprocedure Evaluation (Signed)
  Anesthesia Post-op Note  Patient: Allison Martinez  Procedure(s) Performed: Procedure(s) (LRB) with comments: LAPAROSCOPY DIAGNOSTIC (N/A)  Patient is awake and responsive. Pain and nausea are reasonably well controlled. Vital signs are stable and clinically acceptable. Oxygen saturation is clinically acceptable. There are no apparent anesthetic complications at this time. Patient is ready for discharge.

## 2012-12-16 NOTE — Op Note (Signed)
NAMENORITA, MEIGS                ACCOUNT NO.:  [id number]  MEDICAL RECORD NO.:  [id number]  LOCATION:  [facility]                          FACILITY:  [facility]  PHYSICIAN:  Lenoard Aden, M.D.DATE OF BIRTH:  09/14/1976  DATE OF PROCEDURE: DATE OF DISCHARGE:  12/15/2012                              OPERATIVE REPORT   DESCRIPTION OF PROCEDURE:  After being apprised of risks of anesthesia, infection, bleeding, inability to possibly cure all pelvic pain, the patient was brought to the operating room where she was administered general anesthetic, prepped and draped in usual sterile fashion.  Foley catheter placed.  RUMI retractor placed per vagina and umbilical incision made with a scalpel and carried down to the fascia which was identified with blunt and sharp dissection and elevated with Kocher clamps, opened the peritoneum, entered bluntly, and Hasson trocar placed.  Visualization reveals absent attendance and gallbladder, atraumatic trocar entry.  Uterus with a fundal fibroid measuring about 2 cm, which is subserosal, a small right ovarian cyst, a normal left ovary.  Bilateral normal tubes, normal anterior and posterior cul-de- sac.  Left ovarian cystotomy of clear fluid was performed after placement of a 5-mm trocar in the right lower quadrant.  Clear fluid noted.  No evidence of enlarged cyst formation is noted from previous ultrasound as noted.  At this time, good hemostasis was noted. Procedure was terminated.  All instruments removed under direct visualization.  CO2 released.  The pursestring that had been placed in the fascia was closed with a 0 Vicryl pursestring.  Subcutaneous sutures of 4-0 Vicryl placed on the skin and Dermabond placed.  RUMI retractor and Foley catheter were removed.  The patient tolerated the procedure well, transferred to recovery in good condition.     Lenoard Aden, M.D.     RJT/MEDQ  D:  12/15/2012  T:  12/16/2012  Job:  [id number]

## 2012-12-18 ENCOUNTER — Encounter (HOSPITAL_COMMUNITY): Payer: Self-pay | Admitting: Obstetrics and Gynecology

## 2014-01-24 ENCOUNTER — Emergency Department: Payer: Self-pay | Admitting: Emergency Medicine

## 2014-01-24 LAB — CBC
HCT: 34.8 % — ABNORMAL LOW (ref 35.0–47.0)
HGB: 12.1 g/dL (ref 12.0–16.0)
MCH: 29.1 pg (ref 26.0–34.0)
MCHC: 34.9 g/dL (ref 32.0–36.0)
MCV: 84 fL (ref 80–100)
PLATELETS: 287 10*3/uL (ref 150–440)
RBC: 4.17 10*6/uL (ref 3.80–5.20)
RDW: 14.3 % (ref 11.5–14.5)
WBC: 17.3 10*3/uL — ABNORMAL HIGH (ref 3.6–11.0)

## 2014-01-24 LAB — BASIC METABOLIC PANEL
ANION GAP: 2 — AB (ref 7–16)
BUN: 9 mg/dL (ref 7–18)
CO2: 29 mmol/L (ref 21–32)
Calcium, Total: 8.6 mg/dL (ref 8.5–10.1)
Chloride: 105 mmol/L (ref 98–107)
Creatinine: 0.75 mg/dL (ref 0.60–1.30)
EGFR (African American): >60
GLUCOSE: 92 mg/dL (ref 65–99)
Osmolality: 270 (ref 275–301)
Potassium: 3.8 mmol/L (ref 3.5–5.1)
SODIUM: 136 mmol/L (ref 136–145)

## 2014-01-24 LAB — TROPONIN I

## 2014-01-28 ENCOUNTER — Telehealth: Payer: Self-pay | Admitting: *Deleted

## 2014-01-28 ENCOUNTER — Encounter: Payer: BC Managed Care – PPO | Admitting: Cardiovascular Disease

## 2014-01-28 NOTE — Telephone Encounter (Signed)
Patient called to ask what med she needed to hold for her stress test tomorrow. I instructed her to hold her beta blocker. She informed me she is no longer on beta blocker.

## 2014-01-29 ENCOUNTER — Ambulatory Visit (INDEPENDENT_AMBULATORY_CARE_PROVIDER_SITE_OTHER): Payer: No Typology Code available for payment source | Admitting: Cardiovascular Disease

## 2014-01-29 ENCOUNTER — Encounter: Payer: Self-pay | Admitting: Cardiovascular Disease

## 2014-01-29 VITALS — BP 152/98 | HR 96 | Ht 62.0 in | Wt 278.0 lb

## 2014-01-29 DIAGNOSIS — R079 Chest pain, unspecified: Secondary | ICD-10-CM

## 2014-01-29 NOTE — Procedures (Signed)
   Treadmill Stress test  Indication: Atypical chest pain  Baseline Data:  Resting EKG shows NSR with rate of 93 bpm, no significant ST or T wave changes Resting blood pressure of 152/90 mm Hg Stand bruce protocal was used.  Exercise Data:  Patient exercised for 5 min 0 sec,  Peak heart rate 160 bpm.  This was 88% of the maximum predicted heart rate. She had mild substernal chest pain which was not exercise limiting. It resolved in recovery Peak Blood pressure recorded was 220/100 Maximal work level: 7 METs.  Heart rate at 3 minutes in recovery was 110 bpm. BP response: Hypertensive  HR response: normal  EKG with Exercise: Sinus tachycardia with 0.5 mg upsloping ST depression which is negative for ischemia  FINAL IMPRESSION: Normal exercise stress test. No significant EKG changes concerning for ischemia. Below average exercise tolerance. Hypertensive response to exercise.  Recommendation: The patient had exercise-induced chest pain which was not limiting. No ECG changes noted. She also reports frequent palpitations. I recommend evaluation with an echocardiogram. I advised her to cut down on caffeine intake and to quit smoking.

## 2014-01-29 NOTE — Patient Instructions (Addendum)
Your stress test is normal.   Your physician has requested that you have an echocardiogram. Echocardiography is a painless test that uses sound waves to create images of your heart. It provides your doctor with information about the size and shape of your heart and how well your heart's chambers and valves are working. This procedure takes approximately one hour. There are no restrictions for this procedure.  Follow up the same day of echo.

## 2014-02-12 ENCOUNTER — Ambulatory Visit: Payer: No Typology Code available for payment source | Admitting: Cardiovascular Disease

## 2014-02-12 ENCOUNTER — Other Ambulatory Visit: Payer: No Typology Code available for payment source

## 2014-02-22 ENCOUNTER — Ambulatory Visit: Payer: No Typology Code available for payment source | Admitting: Cardiovascular Disease

## 2014-02-22 ENCOUNTER — Other Ambulatory Visit: Payer: No Typology Code available for payment source

## 2014-03-01 ENCOUNTER — Other Ambulatory Visit: Payer: No Typology Code available for payment source

## 2014-03-05 ENCOUNTER — Other Ambulatory Visit: Payer: No Typology Code available for payment source

## 2014-03-05 ENCOUNTER — Ambulatory Visit: Payer: No Typology Code available for payment source | Admitting: Cardiovascular Disease

## 2014-04-08 ENCOUNTER — Ambulatory Visit: Payer: Self-pay | Admitting: Internal Medicine

## 2014-04-08 LAB — CBC CANCER CENTER
Comment - H1-Com2: NORMAL
Eosinophil: 1 %
HCT: 37.3 % (ref 35.0–47.0)
HGB: 12.9 g/dL (ref 12.0–16.0)
Lymphocytes: 23 %
MCH: 28.6 pg (ref 26.0–34.0)
MCHC: 34.6 g/dL (ref 32.0–36.0)
MCV: 83 fL (ref 80–100)
Monocytes: 4 %
PLATELETS: 314 x10 3/mm (ref 150–440)
RBC: 4.52 10*6/uL (ref 3.80–5.20)
RDW: 14.9 % — ABNORMAL HIGH (ref 11.5–14.5)
Segmented Neutrophils: 72 %
WBC: 20.3 x10 3/mm — AB (ref 3.6–11.0)

## 2014-04-08 LAB — SEDIMENTATION RATE: Erythrocyte Sed Rate: 38 mm/hr — ABNORMAL HIGH (ref 0–20)

## 2014-04-10 LAB — PROT IMMUNOELECTROPHORES(ARMC)

## 2014-04-19 ENCOUNTER — Ambulatory Visit: Payer: Self-pay | Admitting: Internal Medicine

## 2014-05-20 ENCOUNTER — Ambulatory Visit: Payer: Self-pay | Admitting: Internal Medicine

## 2014-07-11 ENCOUNTER — Ambulatory Visit: Payer: Self-pay | Admitting: General Practice

## 2014-08-23 ENCOUNTER — Ambulatory Visit: Payer: Self-pay | Admitting: Internal Medicine

## 2014-09-12 LAB — CBC CANCER CENTER
Basophil #: 0.1 x10 3/mm (ref 0.0–0.1)
Basophil %: 0.5 %
EOS ABS: 0.4 x10 3/mm (ref 0.0–0.7)
Eosinophil %: 2.4 %
HCT: 38 % (ref 35.0–47.0)
HGB: 12.7 g/dL (ref 12.0–16.0)
Lymphocyte #: 4.6 x10 3/mm — ABNORMAL HIGH (ref 1.0–3.6)
Lymphocyte %: 25.7 %
MCH: 28.3 pg (ref 26.0–34.0)
MCHC: 33.4 g/dL (ref 32.0–36.0)
MCV: 85 fL (ref 80–100)
MONO ABS: 1.2 x10 3/mm — AB (ref 0.2–0.9)
MONOS PCT: 6.5 %
Neutrophil #: 11.5 x10 3/mm — ABNORMAL HIGH (ref 1.4–6.5)
Neutrophil %: 64.9 %
PLATELETS: 329 x10 3/mm (ref 150–440)
RBC: 4.49 10*6/uL (ref 3.80–5.20)
RDW: 13.7 % (ref 11.5–14.5)
WBC: 17.8 x10 3/mm — AB (ref 3.6–11.0)

## 2014-09-17 LAB — KAPPA/LAMBDA FREE LIGHT CHAINS (ARMC)

## 2014-09-17 LAB — PROT IMMUNOELECTROPHORES(ARMC)

## 2014-09-19 ENCOUNTER — Ambulatory Visit: Payer: Self-pay | Admitting: Internal Medicine

## 2014-10-24 ENCOUNTER — Emergency Department: Payer: Self-pay | Admitting: Student

## 2014-10-24 LAB — CBC
HCT: 36.4 % (ref 35.0–47.0)
HGB: 12.3 g/dL (ref 12.0–16.0)
MCH: 28.8 pg (ref 26.0–34.0)
MCHC: 33.7 g/dL (ref 32.0–36.0)
MCV: 85 fL (ref 80–100)
PLATELETS: 306 10*3/uL (ref 150–440)
RBC: 4.26 10*6/uL (ref 3.80–5.20)
RDW: 14.4 % (ref 11.5–14.5)
WBC: 21.3 10*3/uL — ABNORMAL HIGH (ref 3.6–11.0)

## 2014-10-24 LAB — COMPREHENSIVE METABOLIC PANEL
ALBUMIN: 3.4 g/dL (ref 3.4–5.0)
ALK PHOS: 89 U/L
ALT: 26 U/L
ANION GAP: 6 — AB (ref 7–16)
BUN: 12 mg/dL (ref 7–18)
Bilirubin,Total: 0.3 mg/dL (ref 0.2–1.0)
CALCIUM: 8.8 mg/dL (ref 8.5–10.1)
CREATININE: 0.73 mg/dL (ref 0.60–1.30)
Chloride: 106 mmol/L (ref 98–107)
Co2: 25 mmol/L (ref 21–32)
EGFR (African American): >60
EGFR (Non-African Amer.): >60
Glucose: 90 mg/dL (ref 65–99)
Osmolality: 273 (ref 275–301)
POTASSIUM: 4 mmol/L (ref 3.5–5.1)
SGOT(AST): 20 U/L (ref 15–37)
SODIUM: 137 mmol/L (ref 136–145)
Total Protein: 7.1 g/dL (ref 6.4–8.2)

## 2014-10-24 LAB — HCG, QUANTITATIVE, PREGNANCY: Beta Hcg, Quant.: <1 m[IU]/mL — ABNORMAL LOW

## 2014-10-24 LAB — TROPONIN I

## 2014-11-22 ENCOUNTER — Ambulatory Visit: Payer: Self-pay | Admitting: Orthopedic Surgery

## 2015-01-01 ENCOUNTER — Emergency Department: Payer: Self-pay | Admitting: Emergency Medicine

## 2015-01-07 ENCOUNTER — Ambulatory Visit: Payer: Self-pay | Admitting: Internal Medicine

## 2015-06-09 ENCOUNTER — Ambulatory Visit
Admission: RE | Admit: 2015-06-09 | Discharge: 2015-06-09 | Disposition: A | Discharge status: Home / Self Care | Payer: No Typology Code available for payment source | Source: Ambulatory Visit | Attending: Physician Assistant | Admitting: Physician Assistant

## 2015-06-09 ENCOUNTER — Other Ambulatory Visit: Payer: Self-pay | Admitting: Physician Assistant

## 2015-06-09 DIAGNOSIS — M7989 Other specified soft tissue disorders: Principal | ICD-10-CM

## 2015-06-09 DIAGNOSIS — M79605 Pain in left leg: Secondary | ICD-10-CM | POA: Diagnosis not present

## 2015-06-09 DIAGNOSIS — M79662 Pain in left lower leg: Secondary | ICD-10-CM

## 2015-06-10 ENCOUNTER — Other Ambulatory Visit: Payer: Self-pay

## 2015-06-10 DIAGNOSIS — D72829 Elevated white blood cell count, unspecified: Secondary | ICD-10-CM

## 2015-06-11 ENCOUNTER — Inpatient Hospital Stay: Payer: No Typology Code available for payment source | Attending: Internal Medicine

## 2015-06-11 ENCOUNTER — Ambulatory Visit: Payer: No Typology Code available for payment source | Admitting: Internal Medicine

## 2015-06-24 ENCOUNTER — Ambulatory Visit: Payer: No Typology Code available for payment source | Admitting: Family Medicine

## 2015-06-24 DIAGNOSIS — Z0289 Encounter for other administrative examinations: Secondary | ICD-10-CM

## 2015-09-30 ENCOUNTER — Other Ambulatory Visit: Payer: Self-pay | Admitting: Physician Assistant

## 2015-09-30 DIAGNOSIS — M25572 Pain in left ankle and joints of left foot: Secondary | ICD-10-CM

## 2015-09-30 DIAGNOSIS — R05 Cough: Secondary | ICD-10-CM

## 2015-09-30 DIAGNOSIS — M25571 Pain in right ankle and joints of right foot: Secondary | ICD-10-CM

## 2015-09-30 DIAGNOSIS — R0602 Shortness of breath: Secondary | ICD-10-CM

## 2015-09-30 DIAGNOSIS — R059 Cough, unspecified: Secondary | ICD-10-CM

## 2015-10-01 ENCOUNTER — Ambulatory Visit
Admission: RE | Admit: 2015-10-01 | Discharge: 2015-10-01 | Disposition: A | Discharge status: Home / Self Care | Payer: PRIVATE HEALTH INSURANCE | Source: Ambulatory Visit | Attending: Physician Assistant | Admitting: Physician Assistant

## 2015-10-01 DIAGNOSIS — R05 Cough: Secondary | ICD-10-CM

## 2015-10-01 DIAGNOSIS — R918 Other nonspecific abnormal finding of lung field: Secondary | ICD-10-CM | POA: Diagnosis not present

## 2015-10-01 DIAGNOSIS — R0602 Shortness of breath: Secondary | ICD-10-CM | POA: Diagnosis present

## 2015-10-01 DIAGNOSIS — M25572 Pain in left ankle and joints of left foot: Secondary | ICD-10-CM

## 2015-10-01 DIAGNOSIS — M7731 Calcaneal spur, right foot: Secondary | ICD-10-CM | POA: Diagnosis not present

## 2015-10-01 DIAGNOSIS — M25571 Pain in right ankle and joints of right foot: Secondary | ICD-10-CM

## 2015-10-01 DIAGNOSIS — R059 Cough, unspecified: Secondary | ICD-10-CM

## 2015-10-01 DIAGNOSIS — M7732 Calcaneal spur, left foot: Secondary | ICD-10-CM | POA: Insufficient documentation

## 2015-12-01 ENCOUNTER — Encounter: Payer: Self-pay | Admitting: Physician Assistant

## 2015-12-01 ENCOUNTER — Ambulatory Visit: Payer: Self-pay | Admitting: Physician Assistant

## 2015-12-01 VITALS — BP 140/80 | HR 93 | Temp 98.5°F

## 2015-12-01 DIAGNOSIS — J209 Acute bronchitis, unspecified: Secondary | ICD-10-CM

## 2015-12-01 DIAGNOSIS — Z716 Tobacco abuse counseling: Secondary | ICD-10-CM

## 2015-12-01 MED ORDER — BUPROPION HCL ER (SR) 150 MG PO TB12: 150.0000 mg | ORAL_TABLET | Freq: Every day | ORAL | Status: DC

## 2015-12-01 MED ORDER — IPRATROPIUM-ALBUTEROL 0.5-2.5 (3) MG/3ML IN SOLN
3.0000 mL | RESPIRATORY_TRACT | Status: DC | PRN
Start: 2015-12-01 — End: 2018-01-30

## 2015-12-01 MED ORDER — LEVOFLOXACIN 750 MG PO TABS: 750.0000 mg | ORAL_TABLET | Freq: Every day | ORAL | Status: DC

## 2015-12-01 MED ORDER — PREDNISONE 10 MG (21) PO TBPK: 10.0000 mg | ORAL_TABLET | Freq: Every day | ORAL | Status: DC

## 2015-12-01 NOTE — Progress Notes (Signed)
S: C/o cough and congestion with wheezing and chest tightness, chest is sore from coughing, +fever, chills last night, none now, mucus is green, or cough is dry and hacking; keeping pt awake at night;  denies cardiac type chest pain or sob, v/d, abd pain Remainder ros neg Using inhaler without a lot of relief, + smoker  O: vitals wnl, nad, tms clear, throat injected, neck supple no lymph, lungs with expiratory wheezing, cv rrr, neuro intact, cough is tight and hacking  A:  Acute bronchitis   P:  rx medication: levaquin 750mg  qd x 10d, sterapred ds 10mg  6d dose pack, duoneb nebules with refills, wellbutrin sr 150mg  qd for help with smoking cessation ; smoking cessation, use otc meds, tylenol or motrin as needed for fever/chills, return if not better in 3 -5 days, return earlier if worsening

## 2015-12-23 ENCOUNTER — Encounter: Payer: Self-pay | Admitting: *Deleted

## 2015-12-23 ENCOUNTER — Emergency Department: Payer: Managed Care, Other (non HMO)

## 2015-12-23 ENCOUNTER — Emergency Department
Admission: EM | Admit: 2015-12-23 | Discharge: 2015-12-23 | Disposition: A | Discharge status: Home / Self Care | Payer: Managed Care, Other (non HMO) | Attending: Emergency Medicine | Admitting: Emergency Medicine

## 2015-12-23 DIAGNOSIS — J189 Pneumonia, unspecified organism: Secondary | ICD-10-CM | POA: Diagnosis not present

## 2015-12-23 DIAGNOSIS — I10 Essential (primary) hypertension: Secondary | ICD-10-CM | POA: Diagnosis not present

## 2015-12-23 DIAGNOSIS — Z7952 Long term (current) use of systemic steroids: Secondary | ICD-10-CM | POA: Insufficient documentation

## 2015-12-23 DIAGNOSIS — Z79899 Other long term (current) drug therapy: Secondary | ICD-10-CM | POA: Insufficient documentation

## 2015-12-23 DIAGNOSIS — F17210 Nicotine dependence, cigarettes, uncomplicated: Secondary | ICD-10-CM | POA: Diagnosis not present

## 2015-12-23 DIAGNOSIS — J45901 Unspecified asthma with (acute) exacerbation: Secondary | ICD-10-CM | POA: Diagnosis not present

## 2015-12-23 DIAGNOSIS — R05 Cough: Secondary | ICD-10-CM | POA: Diagnosis present

## 2015-12-23 MED ORDER — IPRATROPIUM-ALBUTEROL 0.5-2.5 (3) MG/3ML IN SOLN
3.0000 mL | Freq: Once | RESPIRATORY_TRACT | Status: AC
  Administered 2015-12-23: 3 mL via RESPIRATORY_TRACT
  Filled 2015-12-23: qty 3

## 2015-12-23 MED ORDER — LEVOFLOXACIN 500 MG PO TABS: 500.0000 mg | ORAL_TABLET | Freq: Every day | ORAL | Status: DC

## 2015-12-23 MED ORDER — HYDROCOD POLST-CPM POLST ER 10-8 MG/5ML PO SUER: 5.0000 mL | Freq: Two times a day (BID) | ORAL | Status: DC

## 2015-12-23 NOTE — ED Provider Notes (Signed)
[facility] Emergency Department Provider Note  ____________________________________________  Time seen: Approximately 8:57 AM  I have reviewed the triage vital signs and the nursing notes.   HISTORY  Chief Complaint URI    HPI Allison Martinez is a 40 y.o. female who presents for evaluation of body aches cough and chills starting 2 days ago.Past medical history significant for asthma used her inhaler this morning but has not used a nebulizer. Patient states feels like that she is wheezing and feels extremely short of breath.   Past Medical History  Diagnosis Date  . Diverticulitis   . Asthma   . Hypothyroidism   . Anemia   . Anxiety   . Hypertension   . Headache(784.0)   . Pneumonia   . H/O hiatal hernia   . Murmur, cardiac   . Migraine     complex    Patient Active Problem List   Diagnosis Date Noted  . Diverticulitis 01/31/2012  . Constipation 01/31/2012  . Hypothyroidism 01/31/2012  . Nausea 01/31/2012  . HTN (hypertension) 01/31/2012    Past Surgical History  Procedure Laterality Date  . Cholecystectomy    . Appendectomy    . Laparoscopy  12/15/2012    Procedure: LAPAROSCOPY DIAGNOSTIC;  Surgeon: Lovenia Kim, MD;  Location: [facility];  Service: Gynecology;  Laterality: N/A;    Current Outpatient Rx  Name  Route  Sig  Dispense  Refill  . albuterol (PROVENTIL HFA;VENTOLIN HFA) 108 (90 BASE) MCG/ACT inhaler   Inhalation   Inhale 2 puffs into the lungs every 6 (six) hours as needed. For shortness of breath away from home         . albuterol (PROVENTIL) (2.5 MG/3ML) 0.083% nebulizer solution   Nebulization   Take 2.5 mg by nebulization 2 (two) times daily as needed. For shortness of breath         . ALPRAZolam (XANAX) 0.5 MG tablet   Oral   Take 0.5 mg by mouth 2 (two) times daily as needed. For anxiety         . bisoprolol (ZEBETA) 5 MG tablet   Oral   Take by mouth.         Marland Kitchen buPROPion (WELLBUTRIN SR) 150 MG  12 hr tablet   Oral   Take 1 tablet (150 mg total) by mouth daily.   30 tablet   6   . chlorpheniramine-HYDROcodone (TUSSIONEX PENNKINETIC ER) 10-8 MG/5ML SUER   Oral   Take 5 mLs by mouth 2 (two) times daily.   120 mL   0   . diclofenac sodium (VOLTAREN) 1 % GEL   Topical   Apply topically.         . hydrochlorothiazide (HYDRODIURIL) 25 MG tablet   Oral   Take 25 mg by mouth daily.         Marland Kitchen ipratropium-albuterol (DUONEB) 0.5-2.5 (3) MG/3ML SOLN   Nebulization   Take 3 mLs by nebulization every 4 (four) hours as needed.   360 mL   6   . levofloxacin (LEVAQUIN) 500 MG tablet   Oral   Take 1 tablet (500 mg total) by mouth daily.   10 tablet   0   . levothyroxine (SYNTHROID, LEVOTHROID) 75 MCG tablet   Oral   Take 75 mcg by mouth daily.         Marland Kitchen omeprazole (PRILOSEC) 20 MG capsule   Oral   Take 20 mg by mouth daily.         Marland Kitchen  oxyCODONE-acetaminophen (PERCOCET/ROXICET) 5-325 MG per tablet   Oral   Take 1-2 tablets by mouth every 4 (four) hours as needed for pain. For pain   40 tablet   0   . predniSONE (STERAPRED UNI-PAK 21 TAB) 10 MG (21) TBPK tablet   Oral   Take 1 tablet (10 mg total) by mouth daily. Take 6 pills on day one then decrease by 1 pill each day   21 tablet   0     Allergies Shellfish allergy; Sulfa antibiotics; and Codeine  Family History  Problem Relation Age of Onset  . Arrhythmia Mother   . Heart disease Father     Social History Social History  Substance Use Topics  . Smoking status: Current Every Day Smoker -- 0.50 packs/day for 6 years    Types: Cigarettes  . Smokeless tobacco: Never Used  . Alcohol Use: Yes     Comment: occasionally    Review of Systems Constitutional: Positive for fever, chills, and body aches. Eyes: No visual changes. ENT: No sore throat. Cardiovascular: Denies chest pain. Respiratory: Denies shortness of breath. Positive for wheezing and cough Gastrointestinal: No abdominal pain.  No nausea,  no vomiting.  No diarrhea.  No constipation. Genitourinary: Negative for dysuria. Musculoskeletal: Negative for back pain. Skin: Negative for rash. Neurological: Negative for headaches, focal weakness or numbness.  10-point ROS otherwise negative.  ____________________________________________   PHYSICAL EXAM:  VITAL SIGNS: ED Triage Vitals  Enc Vitals Group     BP 12/23/15 0849 130/72 mmHg     Pulse Rate 12/23/15 0849 91     Resp 12/23/15 0849 20     Temp 12/23/15 0849 98.8 F (37.1 C)     Temp Source 12/23/15 0849 Oral     SpO2 12/23/15 0849 95 %     Weight 12/23/15 0849 280 lb (127.007 kg)     Height 12/23/15 0849 5' (1.524 m)     Head Cir --      Peak Flow --      Pain Score 12/23/15 0849 7     Pain Loc --      Pain Edu? --      Excl. in Experiment? --     Constitutional: Alert and oriented. Well appearing and in no acute distress. Afebrile this time. Eyes: Conjunctivae are normal. PERRL. EOMI. Head: Atraumatic. Nose: No congestion/rhinnorhea. Mouth/Throat: Mucous membranes are moist.  Oropharynx non-erythematous. Neck: No stridor.   Cardiovascular: Normal rate, regular rhythm. Grossly normal heart sounds.  Good peripheral circulation. Respiratory: Normal respiratory effort.  No retractions. Lungs decreased breath sounds with wheezing noted bilaterally. Musculoskeletal: No lower extremity tenderness nor edema.  No joint effusions. Neurologic:  Normal speech and language. No gross focal neurologic deficits are appreciated. No gait instability. Skin:  Skin is warm, dry and intact. No rash noted. Psychiatric: Mood and affect are normal. Speech and behavior are normal.  ____________________________________________   LABS (all labs ordered are listed, but only abnormal results are displayed)  Labs Reviewed - No data to display ____________________________________________  RADIOLOGY  There is obscuration of the LEFT heart border with opacity better demonstrated on the  lateral view consistent with lingular atelectasis and pneumonia. No effusion or pneumothorax. Prior cervical fusion.  IMPRESSION: LEFT lingular atelectasis and pneumonia. ____________________________________________   PROCEDURES  Procedure(s) performed: None  Critical Care performed: No  ____________________________________________   INITIAL IMPRESSION / ASSESSMENT AND PLAN / ED COURSE  Pertinent labs & imaging results that were available during my care of  the patient were reviewed by me and considered in my medical decision making (see chart for details).  Acute pneumonia. Rx given for Levaquin 500 mg daily 10 days, Tussionex 5 cc 2 times a day as needed for cough. ____________________________________________   FINAL CLINICAL IMPRESSION(S) / ED DIAGNOSES  Final diagnoses:  Recurrent pneumonia      Arlyss Repress, PA-C 12/23/15 [place], MD 12/23/15 1515

## 2015-12-23 NOTE — ED Notes (Signed)
Instructions given regarding pneumonia care, taking entire course of antibiotics and follow-up with pcp.  Patient instructed to come back to the ED with increased shortness of breath or chest pain.  Patient verbalized understanding.

## 2015-12-23 NOTE — ED Notes (Signed)
Pt reports body aches, cough, and chills starting Sunday

## 2015-12-23 NOTE — Discharge Instructions (Signed)

## 2016-01-30 ENCOUNTER — Telehealth: Payer: Self-pay | Admitting: *Deleted

## 2016-01-30 ENCOUNTER — Other Ambulatory Visit: Payer: Self-pay | Admitting: Internal Medicine

## 2016-01-30 NOTE — Telephone Encounter (Signed)
Dr. Rogue Bussing reviewed labs which were faxed to our clinic from [place] are stable and have not changed since patient was last evaluated by Dr. Ma Hillock. msg routed to Leta Baptist, [place] [facility] [place].

## 2016-02-02 DIAGNOSIS — Z6841 Body Mass Index (BMI) 40.0 and over, adult: Secondary | ICD-10-CM

## 2016-02-04 ENCOUNTER — Telehealth: Payer: Self-pay | Admitting: *Deleted

## 2016-02-04 NOTE — Telephone Encounter (Signed)
-----   Message from Reeves Dam sent at 02/04/2016 10:48 AM EST ----- Pt wants a call back cause she is being told one thing and thinks she has leukemia or multi myeloma I read her the telephone note but she don't believe me. Can you call her please.

## 2016-02-09 NOTE — Telephone Encounter (Signed)
Spoke with the patient. I reassured her that Dr. Rogue Bussing reviewed her chart last week (as he was the on call provider). Dr. Jacinto Reap determined that these labs were stable from the previous labs from Dr. Ma Hillock and hematology referral was not urgent and not necessary for the patient to return at this time. He had me call her pmd office to explain that this appointment was not medically necessary.  I explained to the patient that I left this msg with her primary care provider office. However, the patient states that her pmd told her that she should definitively see the hematologist and that this appointment was "medically neccessary." She was told by her pmd that she "had to have a hematology work up for leukemia and myeloma." I reassured her that the labs had not changed since last forwarded to our office. She states that she would rather keep her apt with Dr. Grayland Ormond as this will "make me feel better to ensure her that nothing else is going on."

## 2016-02-17 ENCOUNTER — Ambulatory Visit
Admission: RE | Admit: 2016-02-17 | Discharge: 2016-02-17 | Disposition: A | Discharge status: Home / Self Care | Payer: Managed Care, Other (non HMO) | Source: Ambulatory Visit | Attending: Oncology | Admitting: Oncology

## 2016-02-17 ENCOUNTER — Inpatient Hospital Stay: Payer: Managed Care, Other (non HMO)

## 2016-02-17 ENCOUNTER — Encounter: Payer: Self-pay | Admitting: Oncology

## 2016-02-17 ENCOUNTER — Inpatient Hospital Stay: Payer: Managed Care, Other (non HMO) | Attending: Oncology | Admitting: Oncology

## 2016-02-17 ENCOUNTER — Other Ambulatory Visit: Payer: Self-pay | Admitting: *Deleted

## 2016-02-17 DIAGNOSIS — I10 Essential (primary) hypertension: Secondary | ICD-10-CM | POA: Insufficient documentation

## 2016-02-17 DIAGNOSIS — F17210 Nicotine dependence, cigarettes, uncomplicated: Secondary | ICD-10-CM | POA: Diagnosis not present

## 2016-02-17 DIAGNOSIS — J45909 Unspecified asthma, uncomplicated: Secondary | ICD-10-CM | POA: Diagnosis not present

## 2016-02-17 DIAGNOSIS — E039 Hypothyroidism, unspecified: Secondary | ICD-10-CM | POA: Insufficient documentation

## 2016-02-17 DIAGNOSIS — F419 Anxiety disorder, unspecified: Secondary | ICD-10-CM | POA: Diagnosis not present

## 2016-02-17 DIAGNOSIS — D72829 Elevated white blood cell count, unspecified: Secondary | ICD-10-CM

## 2016-02-17 DIAGNOSIS — Z79899 Other long term (current) drug therapy: Secondary | ICD-10-CM | POA: Diagnosis not present

## 2016-02-17 DIAGNOSIS — D472 Monoclonal gammopathy: Secondary | ICD-10-CM | POA: Insufficient documentation

## 2016-02-17 DIAGNOSIS — M47812 Spondylosis without myelopathy or radiculopathy, cervical region: Secondary | ICD-10-CM | POA: Diagnosis not present

## 2016-02-17 DIAGNOSIS — Z9889 Other specified postprocedural states: Secondary | ICD-10-CM | POA: Insufficient documentation

## 2016-02-17 DIAGNOSIS — M5030 Other cervical disc degeneration, unspecified cervical region: Secondary | ICD-10-CM | POA: Insufficient documentation

## 2016-02-17 DIAGNOSIS — M47816 Spondylosis without myelopathy or radiculopathy, lumbar region: Secondary | ICD-10-CM | POA: Insufficient documentation

## 2016-02-17 DIAGNOSIS — M5136 Other intervertebral disc degeneration, lumbar region: Secondary | ICD-10-CM | POA: Diagnosis not present

## 2016-02-17 DIAGNOSIS — R899 Unspecified abnormal finding in specimens from other organs, systems and tissues: Secondary | ICD-10-CM

## 2016-02-17 LAB — CBC WITH DIFFERENTIAL/PLATELET
Basophils Absolute: 0.1 10*3/uL (ref 0–0.1)
Basophils Relative: 1 %
EOS ABS: 0.4 10*3/uL (ref 0–0.7)
EOS PCT: 3 %
HCT: 34.1 % — ABNORMAL LOW (ref 35.0–47.0)
Hemoglobin: 11.9 g/dL — ABNORMAL LOW (ref 12.0–16.0)
LYMPHS ABS: 4.8 10*3/uL — AB (ref 1.0–3.6)
LYMPHS PCT: 30 %
MCH: 28.3 pg (ref 26.0–34.0)
MCHC: 35 g/dL (ref 32.0–36.0)
MCV: 80.9 fL (ref 80.0–100.0)
MONO ABS: 0.9 10*3/uL (ref 0.2–0.9)
Monocytes Relative: 6 %
Neutro Abs: 9.8 10*3/uL — ABNORMAL HIGH (ref 1.4–6.5)
Neutrophils Relative %: 60 %
PLATELETS: 292 10*3/uL (ref 150–440)
RBC: 4.21 MIL/uL (ref 3.80–5.20)
RDW: 14.5 % (ref 11.5–14.5)
WBC: 16 10*3/uL — ABNORMAL HIGH (ref 3.6–11.0)

## 2016-02-17 LAB — BASIC METABOLIC PANEL
ANION GAP: 5 (ref 5–15)
BUN: 11 mg/dL (ref 6–20)
CALCIUM: 9.1 mg/dL (ref 8.9–10.3)
CO2: 27 mmol/L (ref 22–32)
Chloride: 103 mmol/L (ref 101–111)
Creatinine, Ser: 0.71 mg/dL (ref 0.44–1.00)
GLUCOSE: 111 mg/dL — AB (ref 65–99)
Potassium: 3.2 mmol/L — ABNORMAL LOW (ref 3.5–5.1)
Sodium: 135 mmol/L (ref 135–145)

## 2016-02-17 NOTE — Progress Notes (Signed)
Pt states she was sent here for m spike, and iga and her wbc is always elevated.  She has muscle pain in legs, and arm and achille tendonitis. She has bone spurs.  She has seen a rheumatologist that says she does not need to be seen. And needs to come here to myeloma work up.

## 2016-02-18 LAB — MULTIPLE MYELOMA PANEL, SERUM
ALBUMIN SERPL ELPH-MCNC: 3.4 g/dL (ref 2.9–4.4)
ALBUMIN/GLOB SERPL: 1.2 (ref 0.7–1.7)
ALPHA 1: 0.2 g/dL (ref 0.0–0.4)
ALPHA2 GLOB SERPL ELPH-MCNC: 0.8 g/dL (ref 0.4–1.0)
B-GLOBULIN SERPL ELPH-MCNC: 1.3 g/dL (ref 0.7–1.3)
GAMMA GLOB SERPL ELPH-MCNC: 0.7 g/dL (ref 0.4–1.8)
GLOBULIN, TOTAL: 3 g/dL (ref 2.2–3.9)
IGG (IMMUNOGLOBIN G), SERUM: 743 mg/dL (ref 700–1600)
IgA: 548 mg/dL — ABNORMAL HIGH (ref 87–352)
IgM, Serum: 39 mg/dL (ref 26–217)
M PROTEIN SERPL ELPH-MCNC: 0.3 g/dL — AB
Total Protein ELP: 6.4 g/dL (ref 6.0–8.5)

## 2016-02-18 LAB — PROTEIN ELECTROPHORESIS, SERUM
A/G RATIO SPE: 1.2 (ref 0.7–1.7)
ALPHA-2-GLOBULIN: 0.8 g/dL (ref 0.4–1.0)
Albumin ELP: 3.6 g/dL (ref 2.9–4.4)
Alpha-1-Globulin: 0.2 g/dL (ref 0.0–0.4)
Beta Globulin: 1.3 g/dL (ref 0.7–1.3)
GLOBULIN, TOTAL: 2.9 g/dL (ref 2.2–3.9)
Gamma Globulin: 0.7 g/dL (ref 0.4–1.8)
M-Spike, %: 0.3 g/dL — ABNORMAL HIGH
TOTAL PROTEIN ELP: 6.5 g/dL (ref 6.0–8.5)

## 2016-02-18 LAB — KAPPA/LAMBDA LIGHT CHAINS
KAPPA FREE LGHT CHN: 58.26 mg/L — AB (ref 3.30–19.40)
Kappa, lambda light chain ratio: 4.66 — ABNORMAL HIGH (ref 0.26–1.65)
Lambda free light chains: 12.49 mg/L (ref 5.71–26.30)

## 2016-02-18 LAB — IGG, IGA, IGM
IGG (IMMUNOGLOBIN G), SERUM: 745 mg/dL (ref 700–1600)
IgA: 547 mg/dL — ABNORMAL HIGH (ref 87–352)
IgM, Serum: 41 mg/dL (ref 26–217)

## 2016-02-28 NOTE — Progress Notes (Signed)
[facility]  [phone] [phone]) [phone]  ID: Shelby Mattocks OB: 1976-05-02  MR#: [id number]  NLG#:[id number]  Patient Care Team: Lavera Guise, MD as PCP - General (Internal Medicine)  CHIEF COMPLAINT:  Chief Complaint  Patient presents with  . leukocytosis    INTERVAL HISTORY: Patient is a 40 year old female with a history of MGUS and persistently elevated white blood cell count. She was last evaluated in clinic in May 2015. She has multiple medical complaints that are all chronic and unchanged. She currently feels that her baseline. She has no neurologic complaints. She denies any recent fevers or illnesses. She has a good appetite and denies weight loss. She denies any chest pain or shortness of breath. She denies any nausea, vomiting, constipation, or diarrhea. She has no urinary complaints. Patient feels at her baseline and offers no specific complaints today.  REVIEW OF SYSTEMS:   Review of Systems  Constitutional: Negative.  Negative for fever, weight loss and malaise/fatigue.  Respiratory: Negative.  Negative for sputum production.   Cardiovascular: Negative.  Negative for chest pain.  Gastrointestinal: Negative.  Negative for abdominal pain.  Musculoskeletal: Positive for back pain, joint pain and neck pain.  Neurological: Negative.  Negative for weakness.  Psychiatric/Behavioral: Negative.     As per HPI. Otherwise, a complete review of systems is negatve.  PAST MEDICAL HISTORY: Past Medical History  Diagnosis Date  . Diverticulitis   . Asthma   . Hypothyroidism   . Anemia   . Anxiety   . Hypertension   . Headache(784.0)   . Pneumonia   . H/O hiatal hernia   . Murmur, cardiac   . Migraine     complex  . Achilles tendonitis     PAST SURGICAL HISTORY: Past Surgical History  Procedure Laterality Date  . Cholecystectomy    . Appendectomy    . Laparoscopy  12/15/2012    Procedure: LAPAROSCOPY DIAGNOSTIC;  Surgeon:  Lovenia Kim, MD;  Location: [facility];  Service: Gynecology;  Laterality: N/A;  . Anterior cervical decomp/discectomy fusion      FAMILY HISTORY Family History  Problem Relation Age of Onset  . Arrhythmia Mother   . Heart disease Father        ADVANCED DIRECTIVES:    HEALTH MAINTENANCE: Social History  Substance Use Topics  . Smoking status: Current Every Day Smoker -- 0.50 packs/day for 6 years    Types: Cigarettes  . Smokeless tobacco: Never Used  . Alcohol Use: Yes     Comment: occasionally     Colonoscopy:  PAP:  Bone density:  Lipid panel:  Allergies  Allergen Reactions  . Shellfish Allergy Anaphylaxis  . Sulfa Antibiotics Anaphylaxis  . Codeine Itching    Current Outpatient Prescriptions  Medication Sig Dispense Refill  . albuterol (PROVENTIL HFA;VENTOLIN HFA) 108 (90 BASE) MCG/ACT inhaler Inhale 2 puffs into the lungs every 6 (six) hours as needed. For shortness of breath away from home    . albuterol (PROVENTIL) (2.5 MG/3ML) 0.083% nebulizer solution Take 2.5 mg by nebulization 2 (two) times daily as needed. For shortness of breath    . bisoprolol (ZEBETA) 5 MG tablet Take by mouth.    Marland Kitchen buPROPion (WELLBUTRIN SR) 150 MG 12 hr tablet Take 1 tablet (150 mg total) by mouth daily. 30 tablet 6  . gabapentin (NEURONTIN) 100 MG capsule Take 100 mg by mouth 3 (three) times daily.    . hydrochlorothiazide (HYDRODIURIL) 25 MG tablet Take 25 mg by mouth  daily.    . ipratropium-albuterol (DUONEB) 0.5-2.5 (3) MG/3ML SOLN Take 3 mLs by nebulization every 4 (four) hours as needed. 360 mL 6  . levothyroxine (SYNTHROID, LEVOTHROID) 75 MCG tablet Take 75 mcg by mouth daily.    Marland Kitchen omeprazole (PRILOSEC) 20 MG capsule Take 20 mg by mouth daily.    Marland Kitchen oxyCODONE-acetaminophen (PERCOCET/ROXICET) 5-325 MG per tablet Take 1-2 tablets by mouth every 4 (four) hours as needed for pain. For pain 40 tablet 0   No current facility-administered medications for this visit.     OBJECTIVE: There were no vitals filed for this visit.   There is no weight on file to calculate BMI.    ECOG FS:0 - Asymptomatic  General: Well-developed, well-nourished, no acute distress. Eyes: Pink conjunctiva, anicteric sclera. HEENT: Normocephalic, moist mucous membranes, clear oropharnyx. Lungs: Clear to auscultation bilaterally. Heart: Regular rate and rhythm. No rubs, murmurs, or gallops. Abdomen: Soft, nontender, nondistended. No organomegaly noted, normoactive bowel sounds. Musculoskeletal: No edema, cyanosis, or clubbing. Neuro: Alert, answering all questions appropriately. Cranial nerves grossly intact. Skin: No rashes or petechiae noted. Psych: Normal affect. Lymphatics: No cervical, calvicular, axillary or inguinal LAD.   LAB RESULTS:  Lab Results  Component Value Date   NA 135 02/17/2016   K 3.2* 02/17/2016   CL 103 02/17/2016   CO2 27 02/17/2016   GLUCOSE 111* 02/17/2016   BUN 11 02/17/2016   CREATININE 0.71 02/17/2016   CALCIUM 9.1 02/17/2016   PROT 7.1 10/24/2014   ALBUMIN 3.4 10/24/2014   AST 20 10/24/2014   ALT 26 10/24/2014   ALKPHOS 89 10/24/2014   BILITOT 0.3 10/24/2014   GFRNONAA >60 02/17/2016   GFRAA >60 02/17/2016    Lab Results  Component Value Date   WBC 16.0* 02/17/2016   NEUTROABS 9.8* 02/17/2016   HGB 11.9* 02/17/2016   HCT 34.1* 02/17/2016   MCV 80.9 02/17/2016   PLT 292 02/17/2016   Lab Results  Component Value Date   TOTALPROTELP 6.5 02/17/2016   TOTALPROTELP 6.4 02/17/2016   ALBUMINELP 3.6 02/17/2016   A1GS 0.2 02/17/2016   A2GS 0.8 02/17/2016   BETS 1.3 02/17/2016   GAMS 0.7 02/17/2016   MSPIKE 0.3* 02/17/2016   SPEI Comment 02/17/2016     STUDIES: Dg Bone Survey Met  02/17/2016  CLINICAL DATA:  Leukocytosis, generalized body aches and pains for the past several years EXAM: METASTATIC BONE SURVEY COMPARISON:  Chest x-ray of September 01, 2012 FINDINGS: Chest x-ray: The lungs are adequately inflated. There are  coarse lung markings on the left which appear new and may reflect atelectasis or interval scarring. The heart and pulmonary vascularity are normal. The mediastinum is normal in width. There are no abnormal paravertebral soft tissue densities. There is no pleural effusion. Calvarium and spine Spine: The calvarium is well mineralized with no lytic nor blastic lesion. The patient has undergone previous interdiscal fusion at C6-7. There is no compression fracture. There is mild degenerative disc space narrowing at L4-5. No lytic nor blastic lesions are observed. Upper extremities:  No lytic or blastic lesions are observed. Pelvis and lower extremities: The bony pelvis exhibits no lytic or blastic lesion. The hips are normal. The lower extremities exhibit no lytic nor blastic lesions. IMPRESSION: No lytic or blastic bony lesions are observed. Degenerative and postsurgical changes in the cervical and lumbar spine are as described. Electronically Signed   By: David  Martinique M.D.   On: 02/17/2016 15:23    ASSESSMENT: MGUS, leukocytosis.  PLAN:  1. MGUS: Patient's M spike is 0.3 today. Sitting in September 2015 00.5. Metastatic bone survey completed on February 17, 2016 reviewed independently and reported as above with no obvious lesions. She has no evidence of endorgan damage. No intervention is needed. Patient does not require bone marrow biopsy. Will continue to monitor M spike 1-2 times per year. 2. Leukocytosis: Patient's white blood cell count is persistently elevated, but approximately her baseline. Her white count has ranged from 12.2-21.3 since at least September 2001.. See, BCR-ABL and peripheral blood flow cytometry were negative. 3. Disposition: Return to clinic in 2 weeks for further evaluation and discussion of her laboratory work.  Patient expressed understanding and was in agreement with this plan. She also understands that She can call clinic at any time with any questions, concerns, or  complaints.   Lloyd Huger, MD   02/28/2016 6:59 AM

## 2016-03-02 ENCOUNTER — Inpatient Hospital Stay: Payer: Managed Care, Other (non HMO) | Attending: Oncology | Admitting: Oncology

## 2016-03-02 VITALS — BP 124/93 | HR 73 | Temp 99.0°F | Resp 16

## 2016-03-02 DIAGNOSIS — F419 Anxiety disorder, unspecified: Secondary | ICD-10-CM | POA: Diagnosis not present

## 2016-03-02 DIAGNOSIS — E039 Hypothyroidism, unspecified: Secondary | ICD-10-CM | POA: Insufficient documentation

## 2016-03-02 DIAGNOSIS — F17210 Nicotine dependence, cigarettes, uncomplicated: Secondary | ICD-10-CM | POA: Insufficient documentation

## 2016-03-02 DIAGNOSIS — Z79899 Other long term (current) drug therapy: Secondary | ICD-10-CM

## 2016-03-02 DIAGNOSIS — I10 Essential (primary) hypertension: Secondary | ICD-10-CM | POA: Diagnosis not present

## 2016-03-02 DIAGNOSIS — G43909 Migraine, unspecified, not intractable, without status migrainosus: Secondary | ICD-10-CM | POA: Diagnosis not present

## 2016-03-02 DIAGNOSIS — K449 Diaphragmatic hernia without obstruction or gangrene: Secondary | ICD-10-CM | POA: Diagnosis not present

## 2016-03-02 DIAGNOSIS — D472 Monoclonal gammopathy: Secondary | ICD-10-CM | POA: Insufficient documentation

## 2016-03-02 DIAGNOSIS — D72829 Elevated white blood cell count, unspecified: Secondary | ICD-10-CM | POA: Diagnosis not present

## 2016-03-02 DIAGNOSIS — J45909 Unspecified asthma, uncomplicated: Secondary | ICD-10-CM | POA: Diagnosis not present

## 2016-03-02 DIAGNOSIS — R778 Other specified abnormalities of plasma proteins: Secondary | ICD-10-CM

## 2016-03-02 NOTE — Progress Notes (Signed)
Here to discuss test results. 

## 2016-03-20 NOTE — Progress Notes (Signed)
[facility]  Telephone:(336) [phone] [phone]) [phone]  ID: Shelby Mattocks OB: Dec 24, 1975  MR#: [id number]  HER#:[id number]  Patient Care Team: Lavera Guise, MD as PCP - General (Internal Medicine)  CHIEF COMPLAINT:  Chief Complaint  Patient presents with  . leukocytosis    INTERVAL HISTORY: Patient returns to clinic today for further evaluation and discussion of her laboratory results. She has multiple medical complaints that are all chronic and unchanged. She currently feels that her baseline. She has no neurologic complaints. She denies any recent fevers or illnesses. She has a good appetite and denies weight loss. She denies any chest pain or shortness of breath. She denies any nausea, vomiting, constipation, or diarrhea. She has no urinary complaints. Patient offers no specific complaints today.  REVIEW OF SYSTEMS:   Review of Systems  Constitutional: Negative.  Negative for fever, weight loss and malaise/fatigue.  Respiratory: Negative.  Negative for sputum production.   Cardiovascular: Negative.  Negative for chest pain.  Gastrointestinal: Negative.  Negative for abdominal pain.  Musculoskeletal: Positive for back pain, joint pain and neck pain.  Neurological: Negative.  Negative for weakness.  Psychiatric/Behavioral: Negative.     As per HPI. Otherwise, a complete review of systems is negatve.  PAST MEDICAL HISTORY: Past Medical History  Diagnosis Date  . Diverticulitis   . Asthma   . Hypothyroidism   . Anemia   . Anxiety   . Hypertension   . Headache(784.0)   . Pneumonia   . H/O hiatal hernia   . Murmur, cardiac   . Migraine     complex  . Achilles tendonitis     PAST SURGICAL HISTORY: Past Surgical History  Procedure Laterality Date  . Cholecystectomy    . Appendectomy    . Laparoscopy  12/15/2012    Procedure: LAPAROSCOPY DIAGNOSTIC;  Surgeon: Lovenia Kim, MD;  Location: [facility];  Service: Gynecology;  Laterality: N/A;  .  Anterior cervical decomp/discectomy fusion      FAMILY HISTORY Family History  Problem Relation Age of Onset  . Arrhythmia Mother   . Heart disease Father        ADVANCED DIRECTIVES:    HEALTH MAINTENANCE: Social History  Substance Use Topics  . Smoking status: Current Every Day Smoker -- 0.50 packs/day for 6 years    Types: Cigarettes  . Smokeless tobacco: Never Used  . Alcohol Use: Yes     Comment: occasionally     Colonoscopy:  PAP:  Bone density:  Lipid panel:  Allergies  Allergen Reactions  . Shellfish Allergy Anaphylaxis  . Sulfa Antibiotics Anaphylaxis  . Codeine Itching    Current Outpatient Prescriptions  Medication Sig Dispense Refill  . albuterol (PROVENTIL HFA;VENTOLIN HFA) 108 (90 BASE) MCG/ACT inhaler Inhale 2 puffs into the lungs every 6 (six) hours as needed. For shortness of breath away from home    . albuterol (PROVENTIL) (2.5 MG/3ML) 0.083% nebulizer solution Take 2.5 mg by nebulization 2 (two) times daily as needed. For shortness of breath    . bisoprolol (ZEBETA) 5 MG tablet Take by mouth.    . desvenlafaxine (PRISTIQ) 50 MG 24 hr tablet Take 50 mg by mouth daily.    Marland Kitchen gabapentin (NEURONTIN) 100 MG capsule Take 100 mg by mouth 3 (three) times daily.    . hydrochlorothiazide (HYDRODIURIL) 25 MG tablet Take 25 mg by mouth daily.    Marland Kitchen ipratropium-albuterol (DUONEB) 0.5-2.5 (3) MG/3ML SOLN Take 3 mLs by nebulization every 4 (four) hours as needed.  360 mL 6  . levothyroxine (SYNTHROID, LEVOTHROID) 75 MCG tablet Take 75 mcg by mouth daily.    Marland Kitchen omeprazole (PRILOSEC) 20 MG capsule Take 20 mg by mouth daily.    Marland Kitchen oxyCODONE-acetaminophen (PERCOCET/ROXICET) 5-325 MG per tablet Take 1-2 tablets by mouth every 4 (four) hours as needed for pain. For pain 40 tablet 0   No current facility-administered medications for this visit.    OBJECTIVE: Filed Vitals:   03/02/16 1547  BP: 124/93  Pulse: 73  Temp: 99 F (37.2 C)  Resp: 16     There is no weight  on file to calculate BMI.    ECOG FS:0 - Asymptomatic  General: Well-developed, well-nourished, no acute distress. Eyes: Pink conjunctiva, anicteric sclera. Lungs: Clear to auscultation bilaterally. Heart: Regular rate and rhythm. No rubs, murmurs, or gallops. Abdomen: Soft, nontender, nondistended. No organomegaly noted, normoactive bowel sounds. Musculoskeletal: No edema, cyanosis, or clubbing. Neuro: Alert, answering all questions appropriately. Cranial nerves grossly intact. Skin: No rashes or petechiae noted. Psych: Normal affect.   LAB RESULTS:  Lab Results  Component Value Date   NA 135 02/17/2016   K 3.2* 02/17/2016   CL 103 02/17/2016   CO2 27 02/17/2016   GLUCOSE 111* 02/17/2016   BUN 11 02/17/2016   CREATININE 0.71 02/17/2016   CALCIUM 9.1 02/17/2016   PROT 7.1 10/24/2014   ALBUMIN 3.4 10/24/2014   AST 20 10/24/2014   ALT 26 10/24/2014   ALKPHOS 89 10/24/2014   BILITOT 0.3 10/24/2014   GFRNONAA >60 02/17/2016   GFRAA >60 02/17/2016    Lab Results  Component Value Date   WBC 16.0* 02/17/2016   NEUTROABS 9.8* 02/17/2016   HGB 11.9* 02/17/2016   HCT 34.1* 02/17/2016   MCV 80.9 02/17/2016   PLT 292 02/17/2016   Lab Results  Component Value Date   TOTALPROTELP 6.5 02/17/2016   TOTALPROTELP 6.4 02/17/2016   ALBUMINELP 3.6 02/17/2016   A1GS 0.2 02/17/2016   A2GS 0.8 02/17/2016   BETS 1.3 02/17/2016   GAMS 0.7 02/17/2016   MSPIKE 0.3* 02/17/2016   SPEI Comment 02/17/2016     STUDIES: No results found.  ASSESSMENT: MGUS, leukocytosis.  PLAN:    1. MGUS: Patient's M spike is 0.3 today which is essentially unchanged from previous in September 2015 was 0.5. Metastatic bone survey completed on February 17, 2016 reviewed independently with no obvious lesions. She has no evidence of endorgan damage. No intervention is needed. Patient does not require bone marrow biopsy. Will continue to monitor M spike 1-2 times per year. 2. Leukocytosis: Patient's white  blood cell count is persistently elevated, but approximately her baseline. Her white count has ranged from 12.2-21.3 since at least September 2001. Previously, BCR-ABL and peripheral blood flow cytometry were negative. 3. Disposition: Return to clinic in 6 months for further evaluation and laboratory work.  Patient expressed understanding and was in agreement with this plan. She also understands that She can call clinic at any time with any questions, concerns, or complaints.   Lloyd Huger, MD   03/20/2016 9:19 AM

## 2016-06-23 ENCOUNTER — Encounter: Payer: Self-pay | Admitting: Physician Assistant

## 2016-06-23 ENCOUNTER — Ambulatory Visit: Payer: Self-pay | Admitting: Physician Assistant

## 2016-06-23 VITALS — BP 120/76 | HR 92 | Temp 99.0°F

## 2016-06-23 DIAGNOSIS — J069 Acute upper respiratory infection, unspecified: Secondary | ICD-10-CM

## 2016-06-23 MED ORDER — FLUTICASONE PROPIONATE 50 MCG/ACT NA SUSP: 2.0000 | Freq: Every day | NASAL | Status: DC

## 2016-06-23 MED ORDER — AZITHROMYCIN 250 MG PO TABS: ORAL_TABLET | ORAL | Status: DC

## 2016-06-23 NOTE — Progress Notes (Signed)
S: C/o runny nose and congestion with sore throat and dry cough for 3 days, + fever, chills, denies cp/sob, v/d; mucus was yellow this am but clear throughout the day, cough is sporadic, also stomach is upset, multiple episodes of diarrhea, pt concerned as she had pneumonia 2 months ago, was treated with levaquin  Using otc meds: none  O: PE: vitals wnl, nad,  perrl eomi, normocephalic, tms dull, nasal mucosa red and swollen, throat injected, neck supple no lymph, lungs c t a, cv rrr, abd soft nontender, bs hyper in all 4 quads; neuro intact  A:  Acute uri, diarrhea   P: flonase for head congestion, drink fluids, continue regular meds , use otc meds of choice, return if not improving in 5 days, return earlier if worsening , once diarrhea subsides if still have uri sx then ok to use zpack,

## 2016-09-02 ENCOUNTER — Inpatient Hospital Stay: Payer: Managed Care, Other (non HMO) | Attending: Oncology

## 2016-09-07 DIAGNOSIS — D472 Monoclonal gammopathy: Secondary | ICD-10-CM | POA: Insufficient documentation

## 2016-09-07 DIAGNOSIS — D72829 Elevated white blood cell count, unspecified: Secondary | ICD-10-CM | POA: Insufficient documentation

## 2016-09-07 NOTE — Progress Notes (Deleted)
[facility]  [phone] [phone]  ID: Shelby Mattocks OB: 11-17-1976  MR#: [id number]  VQQ#:[id number]  Patient Care Team: Lavera Guise, MD as PCP - General (Internal Medicine)  CHIEF COMPLAINT: MGUS, leukocytosis  INTERVAL HISTORY: Patient returns to clinic today for further evaluation and discussion of her laboratory results. She has multiple medical complaints that are all chronic and unchanged. She currently feels that her baseline. She has no neurologic complaints. She denies any recent fevers or illnesses. She has a good appetite and denies weight loss. She denies any chest pain or shortness of breath. She denies any nausea, vomiting, constipation, or diarrhea. She has no urinary complaints. Patient offers no specific complaints today.  REVIEW OF SYSTEMS:   Review of Systems  Constitutional: Negative.  Negative for fever, malaise/fatigue and weight loss.  Respiratory: Negative.  Negative for sputum production.   Cardiovascular: Negative.  Negative for chest pain.  Gastrointestinal: Negative.  Negative for abdominal pain.  Musculoskeletal: Positive for back pain, joint pain and neck pain.  Neurological: Negative.  Negative for weakness.  Psychiatric/Behavioral: Negative.     As per HPI. Otherwise, a complete review of systems is negatve.  PAST MEDICAL HISTORY: Past Medical History:  Diagnosis Date  . Achilles tendonitis   . Anemia   . Anxiety   . Asthma   . Diverticulitis   . H/O hiatal hernia   . Headache(784.0)   . Hypertension   . Hypothyroidism   . Migraine    complex  . Murmur, cardiac   . Pneumonia     PAST SURGICAL HISTORY: Past Surgical History:  Procedure Laterality Date  . ANTERIOR CERVICAL DECOMP/DISCECTOMY FUSION    . APPENDECTOMY    . CHOLECYSTECTOMY    . LAPAROSCOPY  12/15/2012   Procedure: LAPAROSCOPY DIAGNOSTIC;  Surgeon: Lovenia Kim, MD;  Location: [facility];  Service: Gynecology;  Laterality:  N/A;    FAMILY HISTORY Family History  Problem Relation Age of Onset  . Arrhythmia Mother   . Heart disease Father        ADVANCED DIRECTIVES:    HEALTH MAINTENANCE: Social History  Substance Use Topics  . Smoking status: Current Every Day Smoker    Packs/day: 0.50    Years: 6.00    Types: Cigarettes  . Smokeless tobacco: Never Used  . Alcohol use Yes     Comment: occasionally     Colonoscopy:  PAP:  Bone density:  Lipid panel:  Allergies  Allergen Reactions  . Shellfish Allergy Anaphylaxis  . Sulfa Antibiotics Anaphylaxis  . Codeine Itching    Current Outpatient Prescriptions  Medication Sig Dispense Refill  . albuterol (PROVENTIL HFA;VENTOLIN HFA) 108 (90 BASE) MCG/ACT inhaler Inhale 2 puffs into the lungs every 6 (six) hours as needed. For shortness of breath away from home    . albuterol (PROVENTIL) (2.5 MG/3ML) 0.083% nebulizer solution Take 2.5 mg by nebulization 2 (two) times daily as needed. For shortness of breath    . azithromycin (ZITHROMAX Z-PAK) 250 MG tablet 2 pills today then 1 pill a day for 4 days 6 each 0  . bisoprolol (ZEBETA) 5 MG tablet Take by mouth.    . desvenlafaxine (PRISTIQ) 50 MG 24 hr tablet Take 50 mg by mouth daily.    . fluticasone (FLONASE) 50 MCG/ACT nasal spray Place 2 sprays into both nostrils daily. 16 g 6  . gabapentin (NEURONTIN) 100 MG capsule Take 100 mg by mouth 3 (three) times daily.    Marland Kitchen  hydrochlorothiazide (HYDRODIURIL) 25 MG tablet Take 25 mg by mouth daily.    Marland Kitchen ipratropium-albuterol (DUONEB) 0.5-2.5 (3) MG/3ML SOLN Take 3 mLs by nebulization every 4 (four) hours as needed. 360 mL 6  . levothyroxine (SYNTHROID, LEVOTHROID) 75 MCG tablet Take 75 mcg by mouth daily.    Marland Kitchen omeprazole (PRILOSEC) 20 MG capsule Take 20 mg by mouth daily.    Marland Kitchen oxyCODONE-acetaminophen (PERCOCET/ROXICET) 5-325 MG per tablet Take 1-2 tablets by mouth every 4 (four) hours as needed for pain. For pain 40 tablet 0   No current facility-administered  medications for this visit.     OBJECTIVE: There were no vitals filed for this visit.   There is no height or weight on file to calculate BMI.    ECOG FS:0 - Asymptomatic  General: Well-developed, well-nourished, no acute distress. Eyes: Pink conjunctiva, anicteric sclera. Lungs: Clear to auscultation bilaterally. Heart: Regular rate and rhythm. No rubs, murmurs, or gallops. Abdomen: Soft, nontender, nondistended. No organomegaly noted, normoactive bowel sounds. Musculoskeletal: No edema, cyanosis, or clubbing. Neuro: Alert, answering all questions appropriately. Cranial nerves grossly intact. Skin: No rashes or petechiae noted. Psych: Normal affect.   LAB RESULTS:  Lab Results  Component Value Date   NA 135 02/17/2016   K 3.2 (L) 02/17/2016   CL 103 02/17/2016   CO2 27 02/17/2016   GLUCOSE 111 (H) 02/17/2016   BUN 11 02/17/2016   CREATININE 0.71 02/17/2016   CALCIUM 9.1 02/17/2016   PROT 7.1 10/24/2014   ALBUMIN 3.4 10/24/2014   AST 20 10/24/2014   ALT 26 10/24/2014   ALKPHOS 89 10/24/2014   BILITOT 0.3 10/24/2014   GFRNONAA >60 02/17/2016   GFRAA >60 02/17/2016    Lab Results  Component Value Date   WBC 16.0 (H) 02/17/2016   NEUTROABS 9.8 (H) 02/17/2016   HGB 11.9 (L) 02/17/2016   HCT 34.1 (L) 02/17/2016   MCV 80.9 02/17/2016   PLT 292 02/17/2016   Lab Results  Component Value Date   TOTALPROTELP 6.5 02/17/2016   TOTALPROTELP 6.4 02/17/2016   ALBUMINELP 3.6 02/17/2016   A1GS 0.2 02/17/2016   A2GS 0.8 02/17/2016   BETS 1.3 02/17/2016   GAMS 0.7 02/17/2016   MSPIKE 0.3 (H) 02/17/2016   SPEI Comment 02/17/2016     STUDIES: No results found.  ASSESSMENT: MGUS, leukocytosis.  PLAN:    1. MGUS: Patient's M spike is 0.3 today which is essentially unchanged from previous in September 2015 was 0.5. Metastatic bone survey completed on February 17, 2016 reviewed independently with no obvious lesions. She has no evidence of endorgan damage. No intervention  is needed. Patient does not require bone marrow biopsy. Will continue to monitor M spike 1-2 times per year. 2. Leukocytosis: Patient's white blood cell count is persistently elevated, but approximately her baseline. Her white count has ranged from 12.2-21.3 since at least September 2001. Previously, BCR-ABL and peripheral blood flow cytometry were negative. 3. Disposition: Return to clinic in 6 months for further evaluation and laboratory work.  Patient expressed understanding and was in agreement with this plan. She also understands that She can call clinic at any time with any questions, concerns, or complaints.   Lloyd Huger, MD   09/07/2016 1:13 PM

## 2016-09-09 ENCOUNTER — Inpatient Hospital Stay: Payer: Managed Care, Other (non HMO) | Admitting: Oncology

## 2016-10-11 ENCOUNTER — Ambulatory Visit: Payer: Self-pay | Admitting: Physician Assistant

## 2016-10-11 ENCOUNTER — Encounter: Payer: Self-pay | Admitting: Physician Assistant

## 2016-10-11 VITALS — BP 120/70 | HR 92 | Temp 98.0°F

## 2016-10-11 DIAGNOSIS — R062 Wheezing: Secondary | ICD-10-CM

## 2016-10-11 DIAGNOSIS — J069 Acute upper respiratory infection, unspecified: Secondary | ICD-10-CM

## 2016-10-11 LAB — POCT INFLUENZA A/B
INFLUENZA A, POC: NEGATIVE
INFLUENZA B, POC: NEGATIVE

## 2016-10-11 MED ORDER — IPRATROPIUM-ALBUTEROL 0.5-2.5 (3) MG/3ML IN SOLN
3.0000 mL | Freq: Once | RESPIRATORY_TRACT | Status: AC
  Administered 2016-10-11: 3 mL via RESPIRATORY_TRACT

## 2016-10-11 MED ORDER — IPRATROPIUM-ALBUTEROL 0.5-2.5 (3) MG/3ML IN SOLN
3.0000 mL | RESPIRATORY_TRACT | 12 refills | Status: DC | PRN
Start: 2016-10-11 — End: 2016-10-11

## 2016-10-11 MED ORDER — PREDNISONE 10 MG (48) PO TBPK: ORAL_TABLET | Freq: Every day | ORAL | 0 refills | Status: DC

## 2016-10-11 MED ORDER — IPRATROPIUM-ALBUTEROL 0.5-2.5 (3) MG/3ML IN SOLN: 3.0000 mL | Freq: Four times a day (QID) | RESPIRATORY_TRACT | Status: DC

## 2016-10-11 MED ORDER — BUDESONIDE 0.5 MG/2ML IN SUSP: 0.5000 mg | Freq: Two times a day (BID) | RESPIRATORY_TRACT | 12 refills | Status: DC

## 2016-10-11 NOTE — Progress Notes (Signed)
S: C/o runny nose and congestion with dry cough for 2 days, + fever, chills, wheezing, denies cp/sob, v/d; mucus was clear/brown this am but clear throughout the day, cough is sporadic, + smoker; used her albuterol nebulizer this am and took 2 tylenol due to fever  Using otc meds: robitussin  O: PE: vitals wnl, nad,  perrl eomi, normocephalic, tms dull, nasal mucosa red and swollen, throat injected, neck supple no lymph, lungs c  wheezing b/l, cv rrr, neuro intact, flu swab neg duoneb given, increased air movement, still wheezing  A:  Acute flu like illness, wheezing   P: drink fluids, continue regular meds , use otc meds of choice, return if not improving in 5 days, return earlier if worsening , sterapred ds 12 d dose pack, duoneb nebules, pulmicort nebules bid, if mucus is darkening will call in levaquin, discussed smoking cessation

## 2016-10-11 NOTE — Addendum Note (Signed)
Addended by: Rudene Anda T on: 10/11/2016 10:11 AM   Modules accepted: Orders

## 2016-10-14 ENCOUNTER — Ambulatory Visit
Admission: RE | Admit: 2016-10-14 | Discharge: 2016-10-14 | Disposition: A | Discharge status: Home / Self Care | Payer: Managed Care, Other (non HMO) | Source: Ambulatory Visit | Attending: Physician Assistant | Admitting: Physician Assistant

## 2016-10-14 DIAGNOSIS — J069 Acute upper respiratory infection, unspecified: Secondary | ICD-10-CM | POA: Insufficient documentation

## 2016-10-14 DIAGNOSIS — R062 Wheezing: Secondary | ICD-10-CM | POA: Diagnosis not present

## 2016-10-14 DIAGNOSIS — R05 Cough: Secondary | ICD-10-CM | POA: Diagnosis present

## 2016-10-14 NOTE — Addendum Note (Signed)
Addended by: Versie Starks on: 10/14/2016 10:19 AM   Modules accepted: Orders

## 2017-02-03 DIAGNOSIS — D72825 Bandemia: Secondary | ICD-10-CM | POA: Insufficient documentation

## 2017-02-03 DIAGNOSIS — K219 Gastro-esophageal reflux disease without esophagitis: Secondary | ICD-10-CM | POA: Insufficient documentation

## 2017-02-07 ENCOUNTER — Inpatient Hospital Stay: Payer: Managed Care, Other (non HMO) | Attending: Oncology

## 2017-02-07 DIAGNOSIS — K449 Diaphragmatic hernia without obstruction or gangrene: Secondary | ICD-10-CM | POA: Diagnosis not present

## 2017-02-07 DIAGNOSIS — G43909 Migraine, unspecified, not intractable, without status migrainosus: Secondary | ICD-10-CM | POA: Diagnosis not present

## 2017-02-07 DIAGNOSIS — E039 Hypothyroidism, unspecified: Secondary | ICD-10-CM | POA: Diagnosis not present

## 2017-02-07 DIAGNOSIS — D72829 Elevated white blood cell count, unspecified: Secondary | ICD-10-CM | POA: Diagnosis not present

## 2017-02-07 DIAGNOSIS — I10 Essential (primary) hypertension: Secondary | ICD-10-CM | POA: Diagnosis not present

## 2017-02-07 DIAGNOSIS — F419 Anxiety disorder, unspecified: Secondary | ICD-10-CM | POA: Insufficient documentation

## 2017-02-07 DIAGNOSIS — J45909 Unspecified asthma, uncomplicated: Secondary | ICD-10-CM | POA: Diagnosis not present

## 2017-02-07 DIAGNOSIS — R899 Unspecified abnormal finding in specimens from other organs, systems and tissues: Secondary | ICD-10-CM

## 2017-02-07 DIAGNOSIS — D472 Monoclonal gammopathy: Secondary | ICD-10-CM | POA: Diagnosis not present

## 2017-02-07 DIAGNOSIS — F17210 Nicotine dependence, cigarettes, uncomplicated: Secondary | ICD-10-CM | POA: Diagnosis not present

## 2017-02-07 DIAGNOSIS — Z79899 Other long term (current) drug therapy: Secondary | ICD-10-CM | POA: Diagnosis not present

## 2017-02-07 DIAGNOSIS — R778 Other specified abnormalities of plasma proteins: Secondary | ICD-10-CM

## 2017-02-08 LAB — KAPPA/LAMBDA LIGHT CHAINS
KAPPA, LAMDA LIGHT CHAIN RATIO: 4.24 — AB (ref 0.26–1.65)
Kappa free light chain: 50.9 mg/L — ABNORMAL HIGH (ref 3.3–19.4)
LAMDA FREE LIGHT CHAINS: 12 mg/L (ref 5.7–26.3)

## 2017-02-08 LAB — PROTEIN ELECTRO, RANDOM URINE
ALBUMIN ELP UR: 100 %
ALPHA-1-GLOBULIN, U: 0 %
Alpha-2-Globulin, U: 0 %
Beta Globulin, U: 0 %
Gamma Globulin, U: 0 %
TOTAL PROTEIN, URINE-UPE24: 4.1 mg/dL

## 2017-02-09 LAB — PROTEIN ELECTROPHORESIS, SERUM
A/G RATIO SPE: 1.1 (ref 0.7–1.7)
ALBUMIN ELP: 3.5 g/dL (ref 2.9–4.4)
ALPHA-2-GLOBULIN: 0.9 g/dL (ref 0.4–1.0)
Alpha-1-Globulin: 0.2 g/dL (ref 0.0–0.4)
BETA GLOBULIN: 1.4 g/dL — AB (ref 0.7–1.3)
GLOBULIN, TOTAL: 3.3 (ref 2.2–3.9)
Gamma Globulin: 0.8 g/dL (ref 0.4–1.8)
M-Spike, %: 0.6 g/dL — ABNORMAL HIGH
Total Protein ELP: 6.8 g/dL (ref 6.0–8.5)

## 2017-02-17 ENCOUNTER — Telehealth: Payer: Self-pay | Admitting: *Deleted

## 2017-02-17 NOTE — Telephone Encounter (Signed)
Requesting again to have someone call her regarding her lab results drawn 2/19. She does not have FU with [place] at this time, she No Showed twice in September and was put on schedule by someone here for lab only as a reschedule from her missed September appt. Please advise

## 2017-02-18 NOTE — Telephone Encounter (Signed)
Her M-spike and WBC are essentially unchanged from when she was last evaluated in April 2017.  She can f/u in the next couple weeks.  If she is a no show again, we will have to discharge her from clinic.

## 2017-03-07 ENCOUNTER — Ambulatory Visit: Payer: Self-pay | Admitting: Oncology

## 2017-03-13 NOTE — Progress Notes (Signed)
[place]  Telephone:(336) [phone] Fax:(336) [phone]  ID: Shelby Mattocks OB: 13-Feb-1976  MR#: [id number]  QAS#:[id number]  Patient Care Team: Lavera Guise, MD as PCP - General (Internal Medicine)  CHIEF COMPLAINT: MGUS, leukocytosis.  INTERVAL HISTORY: Patient returns to clinic today for further evaluation and discussion of her laboratory results. She has multiple medical complaints that are all chronic and unchanged. She complains or worsening fatigue and joint pain. She also complains of a skin rash that she notices when she is in the sun. She has no neurologic complaints. She denies any recent fevers or illnesses. She has a good appetite and denies weight loss. She denies any chest pain or shortness of breath. She denies any nausea, vomiting, constipation, or diarrhea. She has no urinary complaints. Patient offers no specific complaints today.  REVIEW OF SYSTEMS:   Review of Systems  Constitutional: Negative.  Negative for fever, malaise/fatigue and weight loss.  Respiratory: Negative.  Negative for sputum production.   Cardiovascular: Negative.  Negative for chest pain.  Gastrointestinal: Negative.  Negative for abdominal pain.  Musculoskeletal: Positive for back pain, joint pain, myalgias and neck pain.       Generalized fatigue.  Skin: Positive for rash.       While in the sun  Neurological: Negative.  Negative for weakness.  Psychiatric/Behavioral: Negative.     As per HPI. Otherwise, a complete review of systems is negative.  PAST MEDICAL HISTORY: Past Medical History:  Diagnosis Date  . Achilles tendonitis   . Anemia   . Anxiety   . Asthma   . Diverticulitis   . H/O hiatal hernia   . Headache(784.0)   . Hypertension   . Hypothyroidism   . Migraine    complex  . Murmur, cardiac   . Pneumonia     PAST SURGICAL HISTORY: Past Surgical History:  Procedure Laterality Date  . ANTERIOR CERVICAL DECOMP/DISCECTOMY FUSION    . APPENDECTOMY     . CHOLECYSTECTOMY    . LAPAROSCOPY  12/15/2012   Procedure: LAPAROSCOPY DIAGNOSTIC;  Surgeon: Lovenia Kim, MD;  Location: [facility];  Service: Gynecology;  Laterality: N/A;    FAMILY HISTORY Family History  Problem Relation Age of Onset  . Arrhythmia Mother   . Heart disease Father        ADVANCED DIRECTIVES:    HEALTH MAINTENANCE: Social History  Substance Use Topics  . Smoking status: Current Every Day Smoker    Packs/day: 0.50    Years: 6.00    Types: Cigarettes  . Smokeless tobacco: Never Used  . Alcohol use Yes     Comment: occasionally     Colonoscopy:  PAP:  Bone density:  Lipid panel:  Allergies  Allergen Reactions  . Shellfish Allergy Anaphylaxis  . Sulfa Antibiotics Anaphylaxis  . Codeine Itching    Current Outpatient Prescriptions  Medication Sig Dispense Refill  . albuterol (PROVENTIL HFA;VENTOLIN HFA) 108 (90 BASE) MCG/ACT inhaler Inhale 2 puffs into the lungs every 6 (six) hours as needed. For shortness of breath away from home    . albuterol (PROVENTIL) (2.5 MG/3ML) 0.083% nebulizer solution Take 2.5 mg by nebulization 2 (two) times daily as needed. For shortness of breath    . bisoprolol (ZEBETA) 5 MG tablet Take 2.5 mg by mouth 2 (two) times daily.     . fluticasone (FLONASE) 50 MCG/ACT nasal spray Place 2 sprays into both nostrils daily. 16 g 6  . hydrochlorothiazide (HYDRODIURIL) 25 MG tablet Take 25 mg  by mouth daily.    Marland Kitchen ipratropium-albuterol (DUONEB) 0.5-2.5 (3) MG/3ML SOLN Take 3 mLs by nebulization every 4 (four) hours as needed. 360 mL 6  . levothyroxine (SYNTHROID, LEVOTHROID) 75 MCG tablet Take 75 mcg by mouth daily.    Marland Kitchen omeprazole (PRILOSEC) 20 MG capsule Take 20 mg by mouth daily.    Marland Kitchen VITAMIN D, CHOLECALCIFEROL, PO Take 1 capsule by mouth daily.     No current facility-administered medications for this visit.     OBJECTIVE: Vitals:   03/14/17 1439  BP: (!) 148/91  Pulse: 96  Resp: 18  Temp: 99.1 F (37.3 C)     Body  mass index is 59.59 kg/m.    ECOG FS:0 - Asymptomatic  General: Well-developed, well-nourished, no acute distress. Eyes: Pink conjunctiva, anicteric sclera. Lungs: Clear to auscultation bilaterally. Heart: Regular rate and rhythm. No rubs, murmurs, or gallops. Abdomen: Soft, nontender, nondistended. No organomegaly noted, normoactive bowel sounds. Musculoskeletal: No edema, cyanosis, or clubbing. Neuro: Alert, answering all questions appropriately. Cranial nerves grossly intact. Skin: No rashes or petechiae noted. Psych: Normal affect.   LAB RESULTS:  Lab Results  Component Value Date   NA 135 02/17/2016   K 3.2 (L) 02/17/2016   CL 103 02/17/2016   CO2 27 02/17/2016   GLUCOSE 111 (H) 02/17/2016   BUN 11 02/17/2016   CREATININE 0.71 02/17/2016   CALCIUM 9.1 02/17/2016   PROT 7.1 10/24/2014   ALBUMIN 3.4 10/24/2014   AST 20 10/24/2014   ALT 26 10/24/2014   ALKPHOS 89 10/24/2014   BILITOT 0.3 10/24/2014   GFRNONAA >60 02/17/2016   GFRAA >60 02/17/2016    Lab Results  Component Value Date   WBC 16.0 (H) 02/17/2016   NEUTROABS 9.8 (H) 02/17/2016   HGB 11.9 (L) 02/17/2016   HCT 34.1 (L) 02/17/2016   MCV 80.9 02/17/2016   PLT 292 02/17/2016   Lab Results  Component Value Date   TOTALPROTELP 6.8 02/07/2017   ALBUMINELP 3.5 02/07/2017   A1GS 0.2 02/07/2017   A2GS 0.9 02/07/2017   BETS 1.4 (H) 02/07/2017   GAMS 0.8 02/07/2017   MSPIKE 0.6 (H) 02/07/2017   SPEI Comment 02/07/2017     STUDIES: No results found.  ASSESSMENT: MGUS, leukocytosis.  PLAN:    1. MGUS: Patient's M spike is 0.6 today which is essentially unchanged from previous in September 2017 was 0.3. Metastatic bone survey completed on February 17, 2016 reviewed independently with no obvious lesions. She has no evidence of endorgan damage. No intervention is needed. Patient does not require bone marrow biopsy. RTC in 6 months for labs and MD assess. 2. Leukocytosis: Patient's white blood cell count  is persistently elevated, but approximately her baseline. Her white count has ranged from 12.2-21.3 since at least September 2001. Previously, BCR-ABL and peripheral blood flow cytometry were negative. 3. Disposition: Return to clinic in 6 months for further evaluation and laboratory work.  Faythe Casa, NP  Patient expressed understanding and was in agreement with this plan. She also understands that She can call clinic at any time with any questions, concerns, or complaints.   Patient was seen and evaluated independently and I agree with the assessment and plan as dictated above.  Lloyd Huger, MD 03/20/17 4:33 PM

## 2017-03-14 ENCOUNTER — Inpatient Hospital Stay: Payer: Managed Care, Other (non HMO) | Attending: Oncology | Admitting: Oncology

## 2017-03-14 VITALS — BP 148/91 | HR 96 | Temp 99.1°F | Resp 18 | Wt 305.1 lb

## 2017-03-14 DIAGNOSIS — F419 Anxiety disorder, unspecified: Secondary | ICD-10-CM | POA: Diagnosis not present

## 2017-03-14 DIAGNOSIS — Z79899 Other long term (current) drug therapy: Secondary | ICD-10-CM | POA: Insufficient documentation

## 2017-03-14 DIAGNOSIS — I10 Essential (primary) hypertension: Secondary | ICD-10-CM | POA: Diagnosis not present

## 2017-03-14 DIAGNOSIS — J45909 Unspecified asthma, uncomplicated: Secondary | ICD-10-CM | POA: Diagnosis not present

## 2017-03-14 DIAGNOSIS — D472 Monoclonal gammopathy: Secondary | ICD-10-CM | POA: Diagnosis not present

## 2017-03-14 DIAGNOSIS — E039 Hypothyroidism, unspecified: Secondary | ICD-10-CM | POA: Diagnosis not present

## 2017-03-14 DIAGNOSIS — F17210 Nicotine dependence, cigarettes, uncomplicated: Secondary | ICD-10-CM

## 2017-03-14 DIAGNOSIS — D72829 Elevated white blood cell count, unspecified: Secondary | ICD-10-CM | POA: Diagnosis not present

## 2017-03-14 DIAGNOSIS — K449 Diaphragmatic hernia without obstruction or gangrene: Secondary | ICD-10-CM | POA: Diagnosis not present

## 2017-03-14 NOTE — Progress Notes (Signed)
Complains of generalized muscle and bone pain.

## 2017-04-26 ENCOUNTER — Ambulatory Visit: Payer: Self-pay | Admitting: Physician Assistant

## 2017-05-04 DIAGNOSIS — F17200 Nicotine dependence, unspecified, uncomplicated: Secondary | ICD-10-CM | POA: Insufficient documentation

## 2017-05-04 DIAGNOSIS — G8929 Other chronic pain: Secondary | ICD-10-CM | POA: Insufficient documentation

## 2017-05-04 DIAGNOSIS — R52 Pain, unspecified: Secondary | ICD-10-CM

## 2017-05-06 DIAGNOSIS — R7303 Prediabetes: Secondary | ICD-10-CM | POA: Insufficient documentation

## 2017-06-06 NOTE — Progress Notes (Signed)
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 strength & Tone: Functionally intact  Sensory: Unimpaired  Sensory: Unimpaired  Palpation: No palpable anomalies              Palpation: No palpable anomalies              Specialized Test(s): Deferred         Specialized Test(s): Deferred          Thoracic Spine Exam  Inspection: No masses, redness, or swelling Alignment: Symmetrical Functional ROM: Unrestricted ROM Stability: No instability detected Sensory: Unimpaired Muscle strength & Tone: No palpable anomalies  Lumbar Spine Exam  Inspection: No masses, redness, or swelling Alignment: Symmetrical Functional ROM: Decreased ROM      Stability: No instability detected Muscle strength & Tone: Functionally intact Sensory: Movement-associated pain Palpation: Complains of area being tender to palpation       Provocative Tests: Lumbar Hyperextension and rotation test: evaluation deferred today       Patrick's Maneuver: evaluation deferred today                    Gait & Posture Assessment   Ambulation: Unassisted Gait: Modified gait pattern (slower gait speed, wider stride width, and longer stance duration) associated with morbid obesity Posture: WNL   Lower Extremity Exam    Side: Right lower extremity  Side: Left lower extremity  Inspection: No masses, redness, swelling, or asymmetry. No contractures  Inspection: No masses, redness, swelling, or asymmetry. No contractures  Functional ROM: Unrestricted ROM          Functional ROM: Unrestricted ROM          Muscle strength & Tone: Functionally intact  Muscle strength & Tone: Functionally intact  Sensory: Unimpaired  Sensory: Unimpaired  Palpation: No palpable anomalies  Palpation: No palpable anomalies   Assessment  Primary Diagnosis & Pertinent Problem List: The primary encounter diagnosis was Chronic pain syndrome. Diagnoses of Chronic shoulder pain (Location of Primary Source of Pain) (Bilateral) (L>R), Chronic lower extremity pain (Location of Secondary source of pain) (Bilateral) (L>R), Chronic pain of both upper extremities, Chronic knee pain (Bilateral) (L>R), Chronic wrist pain (Bilateral) (R>L), Diffuse myofascial pain syndrome, Fibromyalgia, Long term prescription opiate use, Opiate use, and Morbid obesity with BMI of 50.0-59.9, adult ([place]) were also pertinent to this visit.  Visit Diagnosis: 1. Chronic pain syndrome   2. Chronic shoulder pain (Location of Primary Source of Pain) (Bilateral) (L>R)   3. Chronic lower extremity pain (Location of Secondary source of pain) (Bilateral) (L>R)   4. Chronic pain of both upper extremities   5. Chronic knee pain (Bilateral) (L>R)   6. Chronic wrist pain (Bilateral) (R>L)   7. Diffuse myofascial pain syndrome   8. Fibromyalgia   9. Long term prescription opiate use   10. Opiate use   11. Morbid obesity with BMI of 50.0-59.9, adult [facility])    Plan of Care  Initial treatment plan:  Please be advised that as per protocol, today's visit has been an evaluation only. We have not  taken over the patient's controlled substance management.  Problem-specific plan: No problem-specific Assessment & Plan notes found for this encounter.  Ordered Lab-work, Procedure(s), Referral(s), & Consult(s): Orders Placed This Encounter  Procedures  . DG Shoulder Left  . DG Shoulder Right  . DG Knee 1-2 Views Left  . DG Knee 1-2 Views Right  . DG Cervical Spine Complete  . DG Lumbar Spine Complete W/Bend  . DG Wrist Complete Left  . DG Wrist Complete Right  . Compliance Drug Analysis, Ur  . C-reactive protein  . Comprehensive metabolic panel  . Sedimentation rate  . Magnesium  . 25-Hydroxyvitamin D Lcms D2+D3  . Vitamin B12  . Ambulatory referral to Psychology   Pharmacotherapy: Medications ordered:  No orders of the defined types were placed in this encounter.  Medications administered during this visit: Ms. Pulse had no medications administered during this visit.   Pharmacotherapy under consideration:  Opioid Analgesics: The patient was informed that there is no guarantee that she would be a candidate for opioid analgesics. The decision will be made following CDC guidelines. This decision will be based on the results of diagnostic studies, as well as Ms. Montagna's risk profile.  Membrane stabilizer: To be determined at a later time Muscle relaxant: To be determined at a later time NSAID: To be determined at a later time Other analgesic(s): To be determined at a later time   Interventional therapies under consideration: Ms. Miltner was informed that there is no guarantee that she would be a candidate for interventional therapies. The decision will be based on the results of diagnostic studies, as well as Ms. Salinas's risk profile.  Possible procedure(s): Diagnostic left cervical epidural steroid injection  Diagnostic bilateral intra-articular shoulder joint injection  Diagnostic bilateral suprascapular nerve block  Possible bilateral suprascapular nerve RFA  Diagnostic left  L5-S1 lumbar epidural steroid injection  Diagnostic bilateral S1 selective nerve block  Diagnostic bilateral intra-articular knee joint injection with local anesthetic and steroid  Possible series of 5 intra-articular, bilateral, Hyalgan knee injections  Diagnostic bilateral Genicular nerve block  Possible bilateral Genicular nerve RFA  Diagnostic bilateral wrist joint injections    Provider-requested follow-up: Return for 2nd Visit, after [facility] eval.  Future Appointments Date Time Provider Department  Center  09/14/2017 2:00 PM [facility] None  09/14/2017 2:15 PM Finnegan, Kathlene November, MD [facility] None    Primary Care Physician: [name], MD Location: [facility] Note by: Kathlen Brunswick. Dossie Arbour, M.D, DABA, DABAPM, DABPM, DABIPP, FIPP Date: 06/07/2017; Time: 10:22 AM  Patient instructions provided during this appointment: Patient Instructions    ____________________________________________________________________________________________  Appointment Policy Summary  It is our goal and responsibility to provide the medical community with assistance in the evaluation and management of patients with chronic pain. Unfortunately our resources are limited. Because we do not have an unlimited amount of time, or available appointments, we are required to closely monitor and manage their use. The following rules exist to maximize their use:  Patient's responsibilities: 1. Punctuality: You are required to be physically present and registered in our facility at least 30 minutes before your appointment. 2. Tardiness: The cutoff is your appointment time. If you have an appointment scheduled for 10:00 AM and you arrive at 10:01, you will be required to reschedule your appointment.  3. Plan ahead: Always assume that you will encounter traffic on your way in. Plan for it. If you are dependent on a driver, make sure they understand these rules and the  need to arrive early. 4. Other appointments and responsibilities: Avoid scheduling any other appointments before or after your pain clinic appointments.  5. Be prepared: Write down everything that you need to discuss with your healthcare provider and give this information to the admitting nurse. Write down the medications that you will need refilled. Bring your pills and bottles (even the empty ones), to all of your appointments, except for those where a procedure is scheduled. 6. No children or pets: Find someone to take care of them. It is not appropriate to bring them in. 7. Scheduling changes: We request "advanced notification" of any changes or cancellations. 8. Advanced notification: Defined as a time period of more than 24 hours prior to the originally scheduled appointment. This allows for the appointment to be offered to other patients. 9. Rescheduling: When a visit is rescheduled, it will require the cancellation of the original appointment. For this reason they both fall within the category of "Cancellations".  10. Cancellations: They require advanced notification. Any cancellation less than 24 hours before the  appointment will be recorded as a "No Show". 11. No Show: Defined as an unkept appointment where the patient failed to notify or declare to the practice their intention or inability to keep the appointment.  Corrective process for repeat offenders:  1. Tardiness: Three (3) episodes of rescheduling due to late arrivals will be recorded as one (1) "No Show". 2. Cancellation or reschedule: Three (3) cancellations or rescheduling will be recorded as one (1) "No Show". 3. "No Shows": Three (3) "No Shows" within a 12 month period will result in discharge from the practice.  ____________________________________________________________________________________________  ____________________________________________________________________________________________  Pain  Scale  Introduction: The pain score used by this practice is the Verbal Numerical Rating Scale (VNRS-11). This is an 11-point scale. It is for adults and children 10 years or older. There are significant differences in how the pain score is reported, used, and applied. Forget everything you learned in the past and learn this scoring system.  General Information: The scale should reflect your current level of pain. Unless you are specifically asked for the level of your worst pain, or your average pain. If you are asked for one of these two, then it should be understood  that it is over the past 24 hours.  Basic Activities of Daily Living (ADL): Personal hygiene, dressing, eating, transferring, and using restroom.  Instructions: Most patients tend to report their level of pain as a combination of two factors, their physical pain and their psychosocial pain. This last one is also known as "suffering" and it is reflection of how physical pain affects you socially and psychologically. From now on, report them separately. From this point on, when asked to report your pain level, report only your physical pain. Use the following table for reference.  Pain Clinic Pain Levels (0-5/10)  Pain Level Score  Description  No Pain 0   Mild pain 1 Nagging, annoying, but does not interfere with basic activities of daily living (ADL). Patients are able to eat, bathe, get dressed, toileting (being able to get on and off the toilet and perform personal hygiene functions), transfer (move in and out of bed or a chair without assistance), and maintain continence (able to control bladder and bowel functions). Blood pressure and heart rate are unaffected. A normal heart rate for a healthy adult ranges from 60 to 100 bpm (beats per minute).   Mild to moderate pain 2 Noticeable and distracting. Impossible to hide from other people. More frequent flare-ups. Still possible to adapt and function close to normal. It can be very  annoying and may have occasional stronger flare-ups. With discipline, patients may get used to it and adapt.   Moderate pain 3 Interferes significantly with activities of daily living (ADL). It becomes difficult to feed, bathe, get dressed, get on and off the toilet or to perform personal hygiene functions. Difficult to get in and out of bed or a chair without assistance. Very distracting. With effort, it can be ignored when deeply involved in activities.   Moderately severe pain 4 Impossible to ignore for more than a few minutes. With effort, patients may still be able to manage work or participate in some social activities. Very difficult to concentrate. Signs of autonomic nervous system discharge are evident: dilated pupils (mydriasis); mild sweating (diaphoresis); sleep interference. Heart rate becomes elevated (>115 bpm). Diastolic blood pressure (lower number) rises above 100 mmHg. Patients find relief in laying down and not moving.   Severe pain 5 Intense and extremely unpleasant. Associated with frowning face and frequent crying. Pain overwhelms the senses.  Ability to do any activity or maintain social relationships becomes significantly limited. Conversation becomes difficult. Pacing back and forth is common, as getting into a comfortable position is nearly impossible. Pain wakes you up from deep sleep. Physical signs will be obvious: pupillary dilation; increased sweating; goosebumps; brisk reflexes; cold, clammy hands and feet; nausea, vomiting or dry heaves; loss of appetite; significant sleep disturbance with inability to fall asleep or to remain asleep. When persistent, significant weight loss is observed due to the complete loss of appetite and sleep deprivation.  Blood pressure and heart rate becomes significantly elevated. Caution: If elevated blood pressure triggers a pounding headache associated with blurred vision, then the patient should immediately seek attention at an urgent or  emergency care unit, as these may be signs of an impending stroke.    Emergency Department Pain Levels (6-10/10)  Emergency Room Pain 6 Severely limiting. Requires emergency care and should not be seen or managed at an outpatient pain management facility. Communication becomes difficult and requires great effort. Assistance to reach the emergency department may be required. Facial flushing and profuse sweating along with potentially dangerous increases in heart  rate and blood pressure will be evident.   Distressing pain 7 Self-care is very difficult. Assistance is required to transport, or use restroom. Assistance to reach the emergency department will be required. Tasks requiring coordination, such as bathing and getting dressed become very difficult.   Disabling pain 8 Self-care is no longer possible. At this level, pain is disabling. The individual is unable to do even the most "basic" activities such as walking, eating, bathing, dressing, transferring to a bed, or toileting. Fine motor skills are lost. It is difficult to think clearly.   Incapacitating pain 9 Pain becomes incapacitating. Thought processing is no longer possible. Difficult to remember your own name. Control of movement and coordination are lost.   The worst pain imaginable 10 At this level, most patients pass out from pain. When this level is reached, collapse of the autonomic nervous system occurs, leading to a sudden drop in blood pressure and heart rate. This in turn results in a temporary and dramatic drop in blood flow to the brain, leading to a loss of consciousness. Fainting is one of the body's self defense mechanisms. Passing out puts the brain in a calmed state and causes it to shut down for a while, in order to begin the healing process.    Summary: 1. Refer to this scale when providing [place] with your pain level. 2. Be accurate and careful when reporting your pain level. This will help with your care. 3. Over-reporting  your pain level will lead to loss of credibility. 4. Even a level of 1/10 means that there is pain and will be treated at our facility. 5. High, inaccurate reporting will be documented as "Symptom Exaggeration", leading to loss of credibility and suspicions of possible secondary gains such as obtaining more narcotics, or wanting to appear disabled, for fraudulent reasons. 6. Only pain levels of 5 or below will be seen at our facility. 7. Pain levels of 6 and above will be sent to the Emergency Department and the appointment cancelled. ____________________________________________________________________________________________

## 2017-06-07 ENCOUNTER — Other Ambulatory Visit: Payer: Self-pay | Admitting: Pain Medicine

## 2017-06-07 ENCOUNTER — Ambulatory Visit: Payer: Managed Care, Other (non HMO) | Attending: Pain Medicine | Admitting: Pain Medicine

## 2017-06-07 ENCOUNTER — Encounter: Payer: Self-pay | Admitting: Pain Medicine

## 2017-06-07 VITALS — BP 146/91 | HR 78 | Temp 98.7°F | Resp 16 | Ht 61.0 in | Wt 300.0 lb

## 2017-06-07 DIAGNOSIS — K449 Diaphragmatic hernia without obstruction or gangrene: Secondary | ICD-10-CM | POA: Insufficient documentation

## 2017-06-07 DIAGNOSIS — G894 Chronic pain syndrome: Secondary | ICD-10-CM | POA: Diagnosis not present

## 2017-06-07 DIAGNOSIS — R200 Anesthesia of skin: Secondary | ICD-10-CM | POA: Insufficient documentation

## 2017-06-07 DIAGNOSIS — Z8673 Personal history of transient ischemic attack (TIA), and cerebral infarction without residual deficits: Secondary | ICD-10-CM | POA: Insufficient documentation

## 2017-06-07 DIAGNOSIS — R531 Weakness: Secondary | ICD-10-CM | POA: Insufficient documentation

## 2017-06-07 DIAGNOSIS — N920 Excessive and frequent menstruation with regular cycle: Secondary | ICD-10-CM | POA: Insufficient documentation

## 2017-06-07 DIAGNOSIS — M5412 Radiculopathy, cervical region: Secondary | ICD-10-CM | POA: Insufficient documentation

## 2017-06-07 DIAGNOSIS — J45909 Unspecified asthma, uncomplicated: Secondary | ICD-10-CM | POA: Insufficient documentation

## 2017-06-07 DIAGNOSIS — M25561 Pain in right knee: Secondary | ICD-10-CM

## 2017-06-07 DIAGNOSIS — M25512 Pain in left shoulder: Secondary | ICD-10-CM | POA: Diagnosis present

## 2017-06-07 DIAGNOSIS — M797 Fibromyalgia: Secondary | ICD-10-CM

## 2017-06-07 DIAGNOSIS — D72829 Elevated white blood cell count, unspecified: Secondary | ICD-10-CM | POA: Insufficient documentation

## 2017-06-07 DIAGNOSIS — M79601 Pain in right arm: Secondary | ICD-10-CM

## 2017-06-07 DIAGNOSIS — R51 Headache: Secondary | ICD-10-CM | POA: Insufficient documentation

## 2017-06-07 DIAGNOSIS — M79605 Pain in left leg: Secondary | ICD-10-CM

## 2017-06-07 DIAGNOSIS — I10 Essential (primary) hypertension: Secondary | ICD-10-CM | POA: Insufficient documentation

## 2017-06-07 DIAGNOSIS — F1190 Opioid use, unspecified, uncomplicated: Secondary | ICD-10-CM

## 2017-06-07 DIAGNOSIS — M25511 Pain in right shoulder: Secondary | ICD-10-CM | POA: Diagnosis present

## 2017-06-07 DIAGNOSIS — F17200 Nicotine dependence, unspecified, uncomplicated: Secondary | ICD-10-CM | POA: Insufficient documentation

## 2017-06-07 DIAGNOSIS — G8929 Other chronic pain: Secondary | ICD-10-CM | POA: Diagnosis not present

## 2017-06-07 DIAGNOSIS — R110 Nausea: Secondary | ICD-10-CM | POA: Diagnosis not present

## 2017-06-07 DIAGNOSIS — M25531 Pain in right wrist: Secondary | ICD-10-CM | POA: Diagnosis not present

## 2017-06-07 DIAGNOSIS — R7303 Prediabetes: Secondary | ICD-10-CM | POA: Diagnosis not present

## 2017-06-07 DIAGNOSIS — Z6841 Body Mass Index (BMI) 40.0 and over, adult: Secondary | ICD-10-CM

## 2017-06-07 DIAGNOSIS — M791 Myalgia: Secondary | ICD-10-CM

## 2017-06-07 DIAGNOSIS — E039 Hypothyroidism, unspecified: Secondary | ICD-10-CM | POA: Insufficient documentation

## 2017-06-07 DIAGNOSIS — M79602 Pain in left arm: Secondary | ICD-10-CM | POA: Diagnosis not present

## 2017-06-07 DIAGNOSIS — Z79891 Long term (current) use of opiate analgesic: Secondary | ICD-10-CM | POA: Insufficient documentation

## 2017-06-07 DIAGNOSIS — D472 Monoclonal gammopathy: Secondary | ICD-10-CM | POA: Diagnosis not present

## 2017-06-07 DIAGNOSIS — M25532 Pain in left wrist: Secondary | ICD-10-CM | POA: Diagnosis not present

## 2017-06-07 DIAGNOSIS — M79604 Pain in right leg: Secondary | ICD-10-CM

## 2017-06-07 DIAGNOSIS — M25562 Pain in left knee: Secondary | ICD-10-CM

## 2017-06-07 DIAGNOSIS — M7918 Myalgia, other site: Secondary | ICD-10-CM

## 2017-06-07 HISTORY — DX: Fibromyalgia: M79.7

## 2017-06-07 NOTE — Progress Notes (Signed)
Safety precautions to be maintained throughout the outpatient stay will include: orient to surroundings, keep bed in low position, maintain call bell within reach at all times, provide assistance with transfer out of bed and ambulation.  

## 2017-06-07 NOTE — Progress Notes (Signed)
Patient is here today d/t chronic pain that is generalized over entire body as well as localized to shoulders,legs and joints.    Patient states that she is currently being treated at cancer center d/t elevated markers of inflammation.

## 2017-06-07 NOTE — Patient Instructions (Signed)
____________________________________________________________________________________________  Appointment Policy Summary  It is our goal and responsibility to provide the medical community with assistance in the evaluation and management of patients with chronic pain. Unfortunately our resources are limited. Because we do not have an unlimited amount of time, or available appointments, we are required to closely monitor and manage their use. The following rules exist to maximize their use:  Patient's responsibilities: 1. Punctuality: You are required to be physically present and registered in our facility at least 30 minutes before your appointment. 2. Tardiness: The cutoff is your appointment time. If you have an appointment scheduled for 10:00 AM and you arrive at 10:01, you will be required to reschedule your appointment.  3. Plan ahead: Always assume that you will encounter traffic on your way in. Plan for it. If you are dependent on a driver, make sure they understand these rules and the need to arrive early. 4. Other appointments and responsibilities: Avoid scheduling any other appointments before or after your pain clinic appointments.  5. Be prepared: Write down everything that you need to discuss with your healthcare provider and give this information to the admitting nurse. Write down the medications that you will need refilled. Bring your pills and bottles (even the empty ones), to all of your appointments, except for those where a procedure is scheduled. 6. No children or pets: Find someone to take care of them. It is not appropriate to bring them in. 7. Scheduling changes: We request "advanced notification" of any changes or cancellations. 8. Advanced notification: Defined as a time period of more than 24 hours prior to the originally scheduled appointment. This allows for the appointment to be offered to other patients. 9. Rescheduling: When a visit is rescheduled, it will require the  cancellation of the original appointment. For this reason they both fall within the category of "Cancellations".  10. Cancellations: They require advanced notification. Any cancellation less than 24 hours before the  appointment will be recorded as a "No Show". 11. No Show: Defined as an unkept appointment where the patient failed to notify or declare to the practice their intention or inability to keep the appointment.  Corrective process for repeat offenders:  1. Tardiness: Three (3) episodes of rescheduling due to late arrivals will be recorded as one (1) "No Show". 2. Cancellation or reschedule: Three (3) cancellations or rescheduling will be recorded as one (1) "No Show". 3. "No Shows": Three (3) "No Shows" within a 12 month period will result in discharge from the practice.  ____________________________________________________________________________________________  ____________________________________________________________________________________________  Pain Scale  Introduction: The pain score used by this practice is the Verbal Numerical Rating Scale (VNRS-11). This is an 11-point scale. It is for adults and children 10 years or older. There are significant differences in how the pain score is reported, used, and applied. Forget everything you learned in the past and learn this scoring system.  General Information: The scale should reflect your current level of pain. Unless you are specifically asked for the level of your worst pain, or your average pain. If you are asked for one of these two, then it should be understood that it is over the past 24 hours.  Basic Activities of Daily Living (ADL): Personal hygiene, dressing, eating, transferring, and using restroom.  Instructions: Most patients tend to report their level of pain as a combination of two factors, their physical pain and their psychosocial pain. This last one is also known as "suffering" and it is reflection of how  physical   pain affects you socially and psychologically. From now on, report them separately. From this point on, when asked to report your pain level, report only your physical pain. Use the following table for reference.  Pain Clinic Pain Levels (0-5/10)  Pain Level Score  Description  No Pain 0   Mild pain 1 Nagging, annoying, but does not interfere with basic activities of daily living (ADL). Patients are able to eat, bathe, get dressed, toileting (being able to get on and off the toilet and perform personal hygiene functions), transfer (move in and out of bed or a chair without assistance), and maintain continence (able to control bladder and bowel functions). Blood pressure and heart rate are unaffected. A normal heart rate for a healthy adult ranges from 60 to 100 bpm (beats per minute).   Mild to moderate pain 2 Noticeable and distracting. Impossible to hide from other people. More frequent flare-ups. Still possible to adapt and function close to normal. It can be very annoying and may have occasional stronger flare-ups. With discipline, patients may get used to it and adapt.   Moderate pain 3 Interferes significantly with activities of daily living (ADL). It becomes difficult to feed, bathe, get dressed, get on and off the toilet or to perform personal hygiene functions. Difficult to get in and out of bed or a chair without assistance. Very distracting. With effort, it can be ignored when deeply involved in activities.   Moderately severe pain 4 Impossible to ignore for more than a few minutes. With effort, patients may still be able to manage work or participate in some social activities. Very difficult to concentrate. Signs of autonomic nervous system discharge are evident: dilated pupils (mydriasis); mild sweating (diaphoresis); sleep interference. Heart rate becomes elevated (>115 bpm). Diastolic blood pressure (lower number) rises above 100 mmHg. Patients find relief in laying down and not  moving.   Severe pain 5 Intense and extremely unpleasant. Associated with frowning face and frequent crying. Pain overwhelms the senses.  Ability to do any activity or maintain social relationships becomes significantly limited. Conversation becomes difficult. Pacing back and forth is common, as getting into a comfortable position is nearly impossible. Pain wakes you up from deep sleep. Physical signs will be obvious: pupillary dilation; increased sweating; goosebumps; brisk reflexes; cold, clammy hands and feet; nausea, vomiting or dry heaves; loss of appetite; significant sleep disturbance with inability to fall asleep or to remain asleep. When persistent, significant weight loss is observed due to the complete loss of appetite and sleep deprivation.  Blood pressure and heart rate becomes significantly elevated. Caution: If elevated blood pressure triggers a pounding headache associated with blurred vision, then the patient should immediately seek attention at an urgent or emergency care unit, as these may be signs of an impending stroke.    Emergency Department Pain Levels (6-10/10)  Emergency Room Pain 6 Severely limiting. Requires emergency care and should not be seen or managed at an outpatient pain management facility. Communication becomes difficult and requires great effort. Assistance to reach the emergency department may be required. Facial flushing and profuse sweating along with potentially dangerous increases in heart rate and blood pressure will be evident.   Distressing pain 7 Self-care is very difficult. Assistance is required to transport, or use restroom. Assistance to reach the emergency department will be required. Tasks requiring coordination, such as bathing and getting dressed become very difficult.   Disabling pain 8 Self-care is no longer possible. At this level, pain is disabling. The individual   is unable to do even the most "basic" activities such as walking, eating, bathing,  dressing, transferring to a bed, or toileting. Fine motor skills are lost. It is difficult to think clearly.   Incapacitating pain 9 Pain becomes incapacitating. Thought processing is no longer possible. Difficult to remember your own name. Control of movement and coordination are lost.   The worst pain imaginable 10 At this level, most patients pass out from pain. When this level is reached, collapse of the autonomic nervous system occurs, leading to a sudden drop in blood pressure and heart rate. This in turn results in a temporary and dramatic drop in blood flow to the brain, leading to a loss of consciousness. Fainting is one of the body's self defense mechanisms. Passing out puts the brain in a calmed state and causes it to shut down for a while, in order to begin the healing process.    Summary: 1. Refer to this scale when providing [place] with your pain level. 2. Be accurate and careful when reporting your pain level. This will help with your care. 3. Over-reporting your pain level will lead to loss of credibility. 4. Even a level of 1/10 means that there is pain and will be treated at our facility. 5. High, inaccurate reporting will be documented as "Symptom Exaggeration", leading to loss of credibility and suspicions of possible secondary gains such as obtaining more narcotics, or wanting to appear disabled, for fraudulent reasons. 6. Only pain levels of 5 or below will be seen at our facility. 7. Pain levels of 6 and above will be sent to the Emergency Department and the appointment cancelled. ____________________________________________________________________________________________

## 2017-06-10 LAB — COMPLIANCE DRUG ANALYSIS, UR

## 2017-06-11 LAB — COMPREHENSIVE METABOLIC PANEL
A/G RATIO: 1.4 (ref 1.2–2.2)
ALT: 19 IU/L (ref 0–32)
AST: 17 IU/L (ref 0–40)
Albumin: 4 g/dL (ref 3.5–5.5)
Alkaline Phosphatase: 88 IU/L (ref 39–117)
BUN/Creatinine Ratio: 14 (ref 9–23)
BUN: 10 mg/dL (ref 6–24)
Bilirubin Total: 0.2 mg/dL (ref 0.0–1.2)
CALCIUM: 9.4 mg/dL (ref 8.7–10.2)
CO2: 21 mmol/L (ref 20–29)
Chloride: 100 mmol/L (ref 96–106)
Creatinine, Ser: 0.69 mg/dL (ref 0.57–1.00)
GFR, EST AFRICAN AMERICAN: 125 mL/min/{1.73_m2} (ref >59)
GFR, EST NON AFRICAN AMERICAN: 108 mL/min/{1.73_m2} (ref >59)
GLOBULIN, TOTAL: 2.9 g/dL (ref 1.5–4.5)
Glucose: 89 mg/dL (ref 65–99)
POTASSIUM: 4.1 mmol/L (ref 3.5–5.2)
Sodium: 138 mmol/L (ref 134–144)
TOTAL PROTEIN: 6.9 g/dL (ref 6.0–8.5)

## 2017-06-11 LAB — 25-HYDROXY VITAMIN D LCMS D2+D3
25-Hydroxy, Vitamin D-2: <1 ng/mL
25-Hydroxy, Vitamin D: 32 ng/mL

## 2017-06-11 LAB — C-REACTIVE PROTEIN: CRP: 21.4 mg/L — ABNORMAL HIGH (ref 0.0–4.9)

## 2017-06-11 LAB — SEDIMENTATION RATE: Sed Rate: 53 mm/hr — ABNORMAL HIGH (ref 0–32)

## 2017-06-11 LAB — 25-HYDROXYVITAMIN D LCMS D2+D3: 25-HYDROXY, VITAMIN D-3: 32 ng/mL

## 2017-06-11 LAB — MAGNESIUM: MAGNESIUM: 1.9 mg/dL (ref 1.6–2.3)

## 2017-06-11 LAB — VITAMIN B12: VITAMIN B 12: 716 pg/mL (ref 232–1245)

## 2017-06-15 ENCOUNTER — Ambulatory Visit: Payer: Self-pay | Admitting: Pain Medicine

## 2017-06-28 ENCOUNTER — Encounter: Payer: Self-pay | Admitting: Physician Assistant

## 2017-06-28 ENCOUNTER — Ambulatory Visit: Payer: Self-pay | Admitting: Physician Assistant

## 2017-06-28 VITALS — BP 120/85 | HR 80 | Temp 98.5°F | Resp 17

## 2017-06-28 DIAGNOSIS — K089 Disorder of teeth and supporting structures, unspecified: Principal | ICD-10-CM

## 2017-06-28 DIAGNOSIS — G8929 Other chronic pain: Secondary | ICD-10-CM

## 2017-06-28 MED ORDER — AMOXICILLIN 875 MG PO TABS: 875.0000 mg | ORAL_TABLET | Freq: Two times a day (BID) | ORAL | 0 refills | Status: DC

## 2017-06-28 NOTE — Progress Notes (Signed)
S: c/o having "really bad teeth", is scheduled to have her teeth pulled so she can get dentures on Aug 1; is having headaches and sinus pressure, some ear pain, worried that she needs an antibiotic, no fever/chills  O: vitals wnl, nad, tms clear, left is a little pink, throat wnl, pt has very poor dentition, neck supple no lymph, lungs c t a, cv rrr  A: chronic dental pain  P amoxil 875mg , f/u with dentist

## 2017-07-04 DIAGNOSIS — R7982 Elevated C-reactive protein (CRP): Secondary | ICD-10-CM | POA: Insufficient documentation

## 2017-07-04 DIAGNOSIS — R700 Elevated erythrocyte sedimentation rate: Secondary | ICD-10-CM | POA: Insufficient documentation

## 2017-07-11 DIAGNOSIS — I499 Cardiac arrhythmia, unspecified: Secondary | ICD-10-CM | POA: Insufficient documentation

## 2017-09-13 ENCOUNTER — Other Ambulatory Visit: Payer: Self-pay | Admitting: Oncology

## 2017-09-13 DIAGNOSIS — D472 Monoclonal gammopathy: Secondary | ICD-10-CM

## 2017-09-13 NOTE — Progress Notes (Signed)
[facility]  [phone] [phone]  ID: Novi Layton Stoker OB: 11/30/1976  MR#: [id number]  CSN#:[id number]  Patient Care Team: Linthavong, Kanhka, MD as PCP - General (Family Medicine)  CHIEF COMPLAINT: MGUS, leukocytosis.  INTERVAL HISTORY: Patient returns to clinic today for further evaluation and discussion of her laboratory results. She has multiple medical complaints that are all chronic and unchanged. She currently feels that her baseline. She has no neurologic complaints. She denies any recent fevers or illnesses. She has a good appetite and denies weight loss. She denies any chest pain or shortness of breath. She denies any nausea, vomiting, constipation, or diarrhea. She has no urinary complaints. Patient offers no specific complaints today.  REVIEW OF SYSTEMS:   Review of Systems  Constitutional: Negative.  Negative for fever, malaise/fatigue and weight loss.  Respiratory: Negative.  Negative for cough and sputum production.   Cardiovascular: Negative.  Negative for chest pain and leg swelling.  Gastrointestinal: Negative.  Negative for abdominal pain.  Genitourinary: Negative.   Musculoskeletal: Positive for back pain, joint pain and neck pain.  Skin: Negative.  Negative for rash.  Neurological: Negative.  Negative for weakness.  Psychiatric/Behavioral: Negative.  The patient is not nervous/anxious.     As per HPI. Otherwise, a complete review of systems is negative.  PAST MEDICAL HISTORY: Past Medical History:  Diagnosis Date  . Achilles tendonitis   . Anemia   . Anxiety   . Asthma   . Diverticulitis   . Fibromyalgia 06/07/2017  . H/O hiatal hernia   . Headache(784.0)   . Hypertension   . Hypothyroidism   . Migraine    complex  . Murmur, cardiac   . Pneumonia     PAST SURGICAL HISTORY: Past Surgical History:  Procedure Laterality Date  . ANTERIOR CERVICAL DECOMP/DISCECTOMY FUSION    . APPENDECTOMY    .  CHOLECYSTECTOMY    . LAPAROSCOPY  12/15/2012   Procedure: LAPAROSCOPY DIAGNOSTIC;  Surgeon: Richard J Taavon, MD;  Location: [facility];  Service: Gynecology;  Laterality: N/A;    FAMILY HISTORY Family History  Problem Relation Age of Onset  . Arrhythmia Mother   . Hypertension Mother   . Heart disease Father        ADVANCED DIRECTIVES:    HEALTH MAINTENANCE: Social History  Substance Use Topics  . Smoking status: Current Every Day Smoker    Packs/day: 0.50    Years: 6.00    Types: Cigarettes  . Smokeless tobacco: Never Used  . Alcohol use Yes     Comment: occasionally     Colonoscopy:  PAP:  Bone density:  Lipid panel:  Allergies  Allergen Reactions  . Fish-Derived Products Anaphylaxis  . Shellfish Allergy Anaphylaxis  . Sulfa Antibiotics Anaphylaxis  . Cefdinir   . Codeine Itching  . Tape   . Tylenol [Acetaminophen]     Cant take tylenol #3    Current Outpatient Prescriptions  Medication Sig Dispense Refill  . hyoscyamine (LEVSIN SL) 0.125 MG SL tablet Place under the tongue.    . nitroGLYCERIN (NITROSTAT) 0.4 MG SL tablet Place under the tongue.    . polyethylene glycol powder (GLYCOLAX/MIRALAX) powder Take as directed for colonic prep.    . albuterol (PROVENTIL HFA;VENTOLIN HFA) 108 (90 BASE) MCG/ACT inhaler Inhale 2 puffs into the lungs every 6 (six) hours as needed. For shortness of breath away from home    . albuterol (PROVENTIL) (2.5 MG/3ML) 0.083% nebulizer solution Take 2.5 mg by nebulization 2 (two)   times daily as needed. For shortness of breath    . amoxicillin (AMOXIL) 875 MG tablet Take 1 tablet (875 mg total) by mouth 2 (two) times daily. 20 tablet 0  . bisoprolol (ZEBETA) 5 MG tablet Take 2.5 mg by mouth 2 (two) times daily.     . hydrochlorothiazide (HYDRODIURIL) 25 MG tablet Take 25 mg by mouth daily.    Marland Kitchen ipratropium-albuterol (DUONEB) 0.5-2.5 (3) MG/3ML SOLN Take 3 mLs by nebulization every 4 (four) hours as needed. 360 mL 6  . levothyroxine  (SYNTHROID, LEVOTHROID) 75 MCG tablet Take 75 mcg by mouth daily.    Marland Kitchen omeprazole (PRILOSEC) 20 MG capsule Take 40 mg by mouth daily.     . vitamin B-12 (CYANOCOBALAMIN) 500 MCG tablet Take 500 mcg by mouth daily.    Marland Kitchen VITAMIN D, CHOLECALCIFEROL, PO Take 1 capsule by mouth daily.     No current facility-administered medications for this visit.     OBJECTIVE: Vitals:   09/14/17 1415  BP: 137/81  Pulse: 79  Temp: 98.5 F (36.9 C)     Body mass index is 57.02 kg/m.    ECOG FS:0 - Asymptomatic  General: Well-developed, well-nourished, no acute distress. Eyes: Pink conjunctiva, anicteric sclera. Lungs: Clear to auscultation bilaterally. Heart: Regular rate and rhythm. No rubs, murmurs, or gallops. Abdomen: Soft, nontender, nondistended. No organomegaly noted, normoactive bowel sounds. Musculoskeletal: No edema, cyanosis, or clubbing. Neuro: Alert, answering all questions appropriately. Cranial nerves grossly intact. Skin: No rashes or petechiae noted. Psych: Normal affect.   LAB RESULTS:  Lab Results  Component Value Date   NA 137 09/14/2017   K 3.5 09/14/2017   CL 105 09/14/2017   CO2 23 09/14/2017   GLUCOSE 112 (H) 09/14/2017   BUN 10 09/14/2017   CREATININE 0.79 09/14/2017   CALCIUM 8.9 09/14/2017   PROT 6.9 06/07/2017   ALBUMIN 4.0 06/07/2017   AST 17 06/07/2017   ALT 19 06/07/2017   ALKPHOS 88 06/07/2017   BILITOT 0.2 06/07/2017   GFRNONAA >60 09/14/2017   GFRAA >60 09/14/2017    Lab Results  Component Value Date   WBC 14.1 (H) 09/14/2017   NEUTROABS 8.0 (H) 09/14/2017   HGB 10.7 (L) 09/14/2017   HCT 31.0 (L) 09/14/2017   MCV 76.7 (L) 09/14/2017   PLT 312 09/14/2017   Lab Results  Component Value Date   TOTALPROTELP 6.3 09/14/2017   ALBUMINELP 3.2 09/14/2017   A1GS 0.2 09/14/2017   A2GS 0.8 09/14/2017   BETS 1.4 (H) 09/14/2017   GAMS 0.7 09/14/2017   MSPIKE Not Observed 09/14/2017   SPEI Comment 09/14/2017     STUDIES: No results  found.  ASSESSMENT: MGUS, leukocytosis.  PLAN:    1. MGUS: Patient's M spike is 0.0 today. Previously, she had an M spike of 0.3. She has a mildly elevated IgA level and kappa free light chain. Metastatic bone survey completed on February 17, 2016 reviewed independently with no obvious lesions. She has no evidence of endorgan damage. No intervention is needed. Patient does not require bone marrow biopsy. Return to clinic in 6 months with repeat laboratory work and further evaluation. If her M spike remains 0.0 at that time, she likely can be discharged from clinic.  2. Leukocytosis: Patient's white blood cell count is persistently elevated, but approximately her baseline. Her white count has ranged from 12.2-21.3 since at least September 2001. Previously, BCR-ABL and peripheral blood flow cytometry were negative.  Approximately 20 minutes was spent in discussion of which  greater than 50% was consultation.  Patient expressed understanding and was in agreement with this plan. She also understands that She can call clinic at any time with any questions, concerns, or complaints.   Timothy J Finnegan, MD   09/18/2017 8:57 AM      

## 2017-09-14 ENCOUNTER — Inpatient Hospital Stay: Payer: Managed Care, Other (non HMO) | Attending: Oncology

## 2017-09-14 ENCOUNTER — Inpatient Hospital Stay (HOSPITAL_BASED_OUTPATIENT_CLINIC_OR_DEPARTMENT_OTHER): Payer: Managed Care, Other (non HMO) | Admitting: Oncology

## 2017-09-14 ENCOUNTER — Encounter: Payer: Self-pay | Admitting: Oncology

## 2017-09-14 VITALS — BP 137/81 | HR 79 | Temp 98.5°F | Wt 301.8 lb

## 2017-09-14 DIAGNOSIS — J45909 Unspecified asthma, uncomplicated: Secondary | ICD-10-CM | POA: Diagnosis not present

## 2017-09-14 DIAGNOSIS — K449 Diaphragmatic hernia without obstruction or gangrene: Secondary | ICD-10-CM | POA: Insufficient documentation

## 2017-09-14 DIAGNOSIS — I10 Essential (primary) hypertension: Secondary | ICD-10-CM | POA: Diagnosis not present

## 2017-09-14 DIAGNOSIS — E039 Hypothyroidism, unspecified: Secondary | ICD-10-CM | POA: Diagnosis not present

## 2017-09-14 DIAGNOSIS — F419 Anxiety disorder, unspecified: Secondary | ICD-10-CM | POA: Insufficient documentation

## 2017-09-14 DIAGNOSIS — M797 Fibromyalgia: Secondary | ICD-10-CM | POA: Insufficient documentation

## 2017-09-14 DIAGNOSIS — D72829 Elevated white blood cell count, unspecified: Secondary | ICD-10-CM | POA: Diagnosis not present

## 2017-09-14 DIAGNOSIS — D472 Monoclonal gammopathy: Secondary | ICD-10-CM | POA: Diagnosis present

## 2017-09-14 DIAGNOSIS — F17210 Nicotine dependence, cigarettes, uncomplicated: Secondary | ICD-10-CM | POA: Diagnosis not present

## 2017-09-14 DIAGNOSIS — Z79899 Other long term (current) drug therapy: Secondary | ICD-10-CM | POA: Insufficient documentation

## 2017-09-14 LAB — CBC WITH DIFFERENTIAL/PLATELET
BASOS ABS: 0.1 10*3/uL (ref 0–0.1)
BASOS PCT: 1 %
EOS ABS: 0.4 10*3/uL (ref 0–0.7)
EOS PCT: 3 %
HCT: 31 % — ABNORMAL LOW (ref 35.0–47.0)
Hemoglobin: 10.7 g/dL — ABNORMAL LOW (ref 12.0–16.0)
Lymphocytes Relative: 35 %
Lymphs Abs: 4.9 10*3/uL — ABNORMAL HIGH (ref 1.0–3.6)
MCH: 26.4 pg (ref 26.0–34.0)
MCHC: 34.4 g/dL (ref 32.0–36.0)
MCV: 76.7 fL — ABNORMAL LOW (ref 80.0–100.0)
Monocytes Absolute: 0.6 10*3/uL (ref 0.2–0.9)
Monocytes Relative: 4 %
Neutro Abs: 8 10*3/uL — ABNORMAL HIGH (ref 1.4–6.5)
Neutrophils Relative %: 57 %
PLATELETS: 312 10*3/uL (ref 150–440)
RBC: 4.04 MIL/uL (ref 3.80–5.20)
RDW: 16.2 % — AB (ref 11.5–14.5)
WBC: 14.1 10*3/uL — ABNORMAL HIGH (ref 3.6–11.0)

## 2017-09-14 LAB — BASIC METABOLIC PANEL
ANION GAP: 9 (ref 5–15)
BUN: 10 mg/dL (ref 6–20)
CALCIUM: 8.9 mg/dL (ref 8.9–10.3)
CO2: 23 mmol/L (ref 22–32)
Chloride: 105 mmol/L (ref 101–111)
Creatinine, Ser: 0.79 mg/dL (ref 0.44–1.00)
GLUCOSE: 112 mg/dL — AB (ref 65–99)
Potassium: 3.5 mmol/L (ref 3.5–5.1)
SODIUM: 137 mmol/L (ref 135–145)

## 2017-09-15 LAB — PROTEIN ELECTROPHORESIS, SERUM
A/G Ratio: 1 (ref 0.7–1.7)
ALPHA-2-GLOBULIN: 0.8 g/dL (ref 0.4–1.0)
Albumin ELP: 3.2 g/dL (ref 2.9–4.4)
Alpha-1-Globulin: 0.2 g/dL (ref 0.0–0.4)
Beta Globulin: 1.4 g/dL — ABNORMAL HIGH (ref 0.7–1.3)
GLOBULIN, TOTAL: 3.1 g/dL (ref 2.2–3.9)
Gamma Globulin: 0.7 g/dL (ref 0.4–1.8)
TOTAL PROTEIN ELP: 6.3 g/dL (ref 6.0–8.5)

## 2017-09-15 LAB — KAPPA/LAMBDA LIGHT CHAINS
KAPPA FREE LGHT CHN: 54.7 mg/L — AB (ref 3.3–19.4)
Kappa, lambda light chain ratio: 4.52 — ABNORMAL HIGH (ref 0.26–1.65)
LAMDA FREE LIGHT CHAINS: 12.1 mg/L (ref 5.7–26.3)

## 2017-09-15 LAB — IGG, IGA, IGM
IGG (IMMUNOGLOBIN G), SERUM: 727 mg/dL (ref 700–1600)
IgA: 653 mg/dL — ABNORMAL HIGH (ref 87–352)
IgM (Immunoglobulin M), Srm: 38 mg/dL (ref 26–217)

## 2017-11-07 ENCOUNTER — Ambulatory Visit: Payer: Self-pay | Admitting: Physician Assistant

## 2017-11-07 ENCOUNTER — Encounter: Payer: Self-pay | Admitting: Physician Assistant

## 2017-11-07 VITALS — BP 125/70 | HR 80 | Temp 98.3°F | Resp 16

## 2017-11-07 DIAGNOSIS — J069 Acute upper respiratory infection, unspecified: Secondary | ICD-10-CM

## 2017-11-07 MED ORDER — FLUCONAZOLE 150 MG PO TABS: ORAL_TABLET | ORAL | 0 refills | Status: DC

## 2017-11-07 MED ORDER — AMOXICILLIN 875 MG PO TABS: 875.0000 mg | ORAL_TABLET | Freq: Two times a day (BID) | ORAL | 0 refills | Status: DC

## 2017-11-07 NOTE — Progress Notes (Signed)
S: Complains of left-sided ear and throat pain, some sinus congestion, states she felt hot but did not take her temperature, has a dry cough, denies chest pain, shortness of breath, vomiting, diarrhea, symptoms for 3 days, has taken Amoxil in the past and done well  O: Vitals are normal, left TM is pink, throat is normal but has some postnasal drip, neck is supple, no lymphadenopathy noted, lungs are clear to auscultation, heart sounds are normal  A: acute uri  P: amoxil 875 bid x 10d, diflucan

## 2017-11-14 DIAGNOSIS — J4520 Mild intermittent asthma, uncomplicated: Secondary | ICD-10-CM | POA: Insufficient documentation

## 2017-12-05 ENCOUNTER — Ambulatory Visit
Admission: RE | Admit: 2017-12-05 | Payer: Managed Care, Other (non HMO) | Source: Ambulatory Visit | Admitting: Gastroenterology

## 2017-12-05 ENCOUNTER — Encounter: Admission: RE | Payer: Self-pay | Source: Ambulatory Visit

## 2017-12-05 HISTORY — DX: Gastro-esophageal reflux disease without esophagitis: K21.9

## 2017-12-05 HISTORY — DX: Bandemia: D72.825

## 2017-12-05 HISTORY — DX: Cardiac arrhythmia, unspecified: I49.9

## 2017-12-05 HISTORY — DX: Irritable bowel syndrome, unspecified: K58.9

## 2017-12-05 SURGERY — COLONOSCOPY WITH PROPOFOL
Anesthesia: General

## 2018-01-12 ENCOUNTER — Encounter: Payer: Self-pay | Admitting: Medical

## 2018-01-12 ENCOUNTER — Ambulatory Visit: Payer: Self-pay | Admitting: Medical

## 2018-01-12 VITALS — BP 120/70 | HR 82 | Temp 98.8°F | Resp 16

## 2018-01-12 DIAGNOSIS — J0100 Acute maxillary sinusitis, unspecified: Secondary | ICD-10-CM

## 2018-01-12 DIAGNOSIS — H6690 Otitis media, unspecified, unspecified ear: Secondary | ICD-10-CM

## 2018-01-12 MED ORDER — AMOXICILLIN 875 MG PO TABS: 875.0000 mg | ORAL_TABLET | Freq: Two times a day (BID) | ORAL | 0 refills | Status: DC

## 2018-01-12 NOTE — Patient Instructions (Signed)
Otitis Media, Adult Otitis media is redness, soreness, and puffiness (swelling) in the space just behind your eardrum (middle ear). It may be caused by allergies or infection. It often happens along with a cold. Follow these instructions at home:  Take your medicine as told. Finish it even if you start to feel better.  Only take over-the-counter or prescription medicines for pain, discomfort, or fever as told by your doctor.  Follow up with your doctor as told. Contact a doctor if:  You have otitis media only in one ear, or bleeding from your nose, or both.  You notice a lump on your neck.  You are not getting better in 3-5 days.  You feel worse instead of better. Get help right away if:  You have pain that is not helped with medicine.  You have puffiness, redness, or pain around your ear.  You get a stiff neck.  You cannot move part of your face (paralysis).  You notice that the bone behind your ear hurts when you touch it. This information is not intended to replace advice given to you by your health care provider. Make sure you discuss any questions you have with your health care provider. Document Released: 05/24/2008 Document Revised: 05/13/2016 Document Reviewed: 07/03/2013 Elsevier Interactive Patient Education  2017 [employer] Sinusitis, Adult Sinusitis is soreness and inflammation of your sinuses. Sinuses are hollow spaces in the bones around your face. They are located:  Around your eyes.  In the middle of your forehead.  Behind your nose.  In your cheekbones.  Your sinuses and nasal passages are lined with a stringy fluid (mucus). Mucus normally drains out of your sinuses. When your nasal tissues get inflamed or swollen, the mucus can get trapped or blocked so air cannot flow through your sinuses. This lets bacteria, viruses, and funguses grow, and that leads to infection. Follow these instructions at home: Medicines  Take, use, or apply over-the-counter  and prescription medicines only as told by your doctor. These may include nasal sprays.  If you were prescribed an antibiotic medicine, take it as told by your doctor. Do not stop taking the antibiotic even if you start to feel better. Hydrate and Humidify  Drink enough water to keep your pee (urine) clear or pale yellow.  Use a cool mist humidifier to keep the humidity level in your home above 50%.  Breathe in steam for 10-15 minutes, 3-4 times a day or as told by your doctor. You can do this in the bathroom while a hot shower is running.  Try not to spend time in cool or dry air. Rest  Rest as much as possible.  Sleep with your head raised (elevated).  Make sure to get enough sleep each night. General instructions  Put a warm, moist washcloth on your face 3-4 times a day or as told by your doctor. This will help with discomfort.  Wash your hands often with soap and water. If there is no soap and water, use hand sanitizer.  Do not smoke. Avoid being around people who are smoking (secondhand smoke).  Keep all follow-up visits as told by your doctor. This is important. Contact a doctor if:  You have a fever.  Your symptoms get worse.  Your symptoms do not get better within 10 days. Get help right away if:  You have a very bad headache.  You cannot stop throwing up (vomiting).  You have pain or swelling around your face or eyes.  You have trouble   seeing.  You feel confused.  Your neck is stiff.  You have trouble breathing. This information is not intended to replace advice given to you by your health care provider. Make sure you discuss any questions you have with your health care provider. Document Released: 05/24/2008 Document Revised: 08/01/2016 Document Reviewed: 10/01/2015 Elsevier Interactive Patient Education  2018 Elsevier Inc.  

## 2018-01-12 NOTE — Progress Notes (Signed)
   Subjective:    Patient ID: Allison Martinez, female    DOB: June 21, 1976, 42 y.o.   MRN: [id number]  HPI  42 yo female in non acute distress started yesterday with nasal congestion, facial pain maxillary area and left ear pain. Denies fever but said she had chills last night. Denies chest pain but has had a little shortness of breath. She is a smoker and has Asthma.   Review of Systems  Constitutional: Positive for chills. Negative for fever.  HENT: Positive for congestion, ear pain (left) and sneezing. Negative for sore throat.   Respiratory: Positive for shortness of breath ("a little").   Cardiovascular: Negative for chest pain.  Musculoskeletal: Positive for myalgias (achy).  Skin: Negative for rash.  Allergic/Immunologic: Positive for environmental allergies.  Neurological: Positive for headaches (maxillary and forehead pain). Negative for dizziness, syncope and light-headedness.   Has her follow up with cardiology in March , denies any cp now.    Objective:   Physical Exam  Constitutional: She is oriented to person, place, and time. She appears well-developed and well-nourished.  HENT:  Head: Normocephalic and atraumatic.  Right Ear: Hearing, external ear and ear canal normal. No middle ear effusion.  Left Ear: Hearing, external ear and ear canal normal. Tympanic membrane is erythematous.  Nose: Mucosal edema and rhinorrhea present.  Mouth/Throat: Oropharynx is clear and moist.  Eyes: Conjunctivae and EOM are normal. Pupils are equal, round, and reactive to light.  Neck: Normal range of motion. Neck supple.  Cardiovascular: Normal rate, regular rhythm and normal heart sounds.  Pulmonary/Chest: Effort normal and breath sounds normal.  Neurological: She is alert and oriented to person, place, and time.  Skin: Skin is warm and dry.  Psychiatric: She has a normal mood and affect. Her behavior is normal. Judgment and thought content normal.  Nursing note and vitals  reviewed.  Erythema inside both nostrils.       Assessment & Plan:  Sinusitis , Left Otitis Media, perhaps right starting. Smoker Asthma Meds ordered this encounter  Medications  . amoxicillin (AMOXIL) 875 MG tablet    Sig: Take 1 tablet (875 mg total) by mouth 2 (two) times daily.    Dispense:  20 tablet    Refill:  0  Does have allergen to mold , recommended OTC Zyrtec or Claritin , Flonase take as directed. Return in 3-5 days if not improving. Try to cut down or quit smoking. Patient verbalizes understanding and has no questions at discharge.

## 2018-01-25 ENCOUNTER — Ambulatory Visit: Payer: Self-pay | Admitting: Family Medicine

## 2018-01-25 ENCOUNTER — Encounter: Payer: Self-pay | Admitting: Family Medicine

## 2018-01-25 VITALS — BP 120/70 | HR 78 | Temp 98.8°F | Resp 16

## 2018-01-25 DIAGNOSIS — H6982 Other specified disorders of Eustachian tube, left ear: Secondary | ICD-10-CM

## 2018-01-25 DIAGNOSIS — H9202 Otalgia, left ear: Secondary | ICD-10-CM

## 2018-01-25 MED ORDER — FLUTICASONE PROPIONATE 50 MCG/ACT NA SUSP: NASAL | 1 refills | Status: DC

## 2018-01-25 MED ORDER — PREDNISONE 20 MG PO TABS
ORAL_TABLET | ORAL | 0 refills | Status: DC
Start: 2018-01-25 — End: 2018-04-10

## 2018-01-25 NOTE — Patient Instructions (Signed)
Take prednisone 20 mg 3 pills daily for 2 days, then 2 daily for 2 days, then 1 daily for 2 days.  Best taken in the morning.  Use fluticasone nose spray 2 sprays each nostril twice daily for 3 days, then decrease to 2 sprays each nostril daily.  If you are getting severely worse come back.  If symptoms do not abate with this treatment, you probably will need to be referred to an ENT doctor (ear nose and throat)  Take Tylenol 500 mg 2 pills 3 times daily or ibuprofen 200 mg 3-4 pills 3 times daily as needed for pain.  You can actually be on both alternating them if needed.

## 2018-01-25 NOTE — Progress Notes (Signed)
Patient ID: Allison Martinez, female    DOB: 1976-07-25  Age: 42 y.o. MRN: [id number]  Chief Complaint  Patient presents with  . Follow-up    left ear pain    Subjective:   Patient was here on January 24 with about a one-week history of your symptoms.  She had a red left ear and was treated with antibiotics.  She seemed to improve some but now has had recurrent bad ear pain.  She has been finished her antibiotics for several days.  She does not smoke.  She has not had a lot of nasal congestion or cough.  She does not have a history of having had a lot of ear problems in the past.  She is not diabetic.  She is generally  Current allergies, medications, problem list, past/family and social histories reviewed.  Objective:  BP 120/70   Pulse 78   Temp 98.8 F (37.1 C) (Oral)   Resp 16   SpO2 95%   No major distress.  Right TM looks normal.  Left TM also looks normal.  Landmarks look good.  Throat clear.  Neck supple without nodes or thyromegaly.  No TMJ pain or tenderness.  Assessment & Plan:   Assessment: No diagnosis found.    Plan: Otalgia, probably from eustachian tube dysfunction still.  We will give her a course of steroids.  If she keeps having symptoms after that we will send her to an ENT doctor.  No orders of the defined types were placed in this encounter.   No orders of the defined types were placed in this encounter.        There are no Patient Instructions on file for this visit.   No Follow-up on file.   Neila Teem, MD 01/25/2018

## 2018-01-30 ENCOUNTER — Ambulatory Visit: Payer: Self-pay | Admitting: Family Medicine

## 2018-01-30 VITALS — BP 120/80 | HR 94 | Temp 98.8°F | Resp 17

## 2018-01-30 DIAGNOSIS — J209 Acute bronchitis, unspecified: Secondary | ICD-10-CM

## 2018-01-30 DIAGNOSIS — J101 Influenza due to other identified influenza virus with other respiratory manifestations: Secondary | ICD-10-CM

## 2018-01-30 DIAGNOSIS — R509 Fever, unspecified: Secondary | ICD-10-CM

## 2018-01-30 LAB — POCT INFLUENZA A/B
Influenza A, POC: POSITIVE — AB
Influenza B, POC: NEGATIVE

## 2018-01-30 MED ORDER — IPRATROPIUM-ALBUTEROL 0.5-2.5 (3) MG/3ML IN SOLN: 3.0000 mL | RESPIRATORY_TRACT | 6 refills | Status: DC | PRN

## 2018-01-30 MED ORDER — HYDROCODONE-HOMATROPINE 5-1.5 MG/5ML PO SYRP: 5.0000 mL | ORAL_SOLUTION | Freq: Four times a day (QID) | ORAL | 0 refills | Status: DC | PRN

## 2018-01-30 MED ORDER — BENZONATATE 100 MG PO CAPS: 100.0000 mg | ORAL_CAPSULE | Freq: Three times a day (TID) | ORAL | 0 refills | Status: DC | PRN

## 2018-01-30 MED ORDER — OSELTAMIVIR PHOSPHATE 75 MG PO CAPS: 75.0000 mg | ORAL_CAPSULE | Freq: Two times a day (BID) | ORAL | 0 refills | Status: DC

## 2018-01-30 NOTE — Progress Notes (Signed)
Patient ID: Allison Martinez, female    DOB: 01-05-1976  Age: 42 y.o. MRN: [id number]  Chief Complaint  Patient presents with  . URI    Subjective:   Patient is back today with flulike symptoms that started Saturday.  She was seen last week.  She has body aches, fevers, generalized malaise, cough, and a little wheezing.  She has history of of asthma and needs some more of her DuoNeb.  She has family but no one else is sick at this time.  Current allergies, medications, problem list, past/family and social histories reviewed.  Objective:  BP 120/80   Pulse 94   Temp 98.8 F (37.1 C) (Oral)   Resp 17   SpO2 98%   Ill-appearing.  Sinuses nontender.  Throat clear.  Neck supple without nodes.  Chest has some soft wheezes right at the bases.  Otherwise clear.  Heart regular without murmur.  Flu test is positive for influenza A.  Assessment & Plan:   Assessment: 1. Influenza A   2. Fever, unspecified   3. Acute bronchitis, unspecified organism       Plan:  Orders Placed This Encounter  Procedures  . POCT Influenza A/B (POC66)    Meds ordered this encounter  Medications  . oseltamivir (TAMIFLU) 75 MG capsule    Sig: Take 1 capsule (75 mg total) by mouth 2 (two) times daily.    Dispense:  10 capsule    Refill:  0  . HYDROcodone-homatropine (HYCODAN) 5-1.5 MG/5ML syrup    Sig: Take 5 mLs by mouth every 6 (six) hours as needed for cough.    Dispense:  120 mL    Refill:  0  . benzonatate (TESSALON) 100 MG capsule    Sig: Take 1-2 capsules (100-200 mg total) by mouth 3 (three) times daily as needed for cough.    Dispense:  40 capsule    Refill:  0  . ipratropium-albuterol (DUONEB) 0.5-2.5 (3) MG/3ML SOLN    Sig: Take 3 mLs by nebulization every 4 (four) hours as needed.    Dispense:  360 mL    Refill:  6         Patient Instructions  Drink plenty of fluids and get enough rest  Not go to work until you have been afebrile for 24 hours and are feeling better.   The cough may linger.  Use your inhaler medication, albuterol or DuoNeb, as necessary for wheezing  Take Tamiflu 1 twice daily  Take ibuprofen or Tylenol as needed for fever and aching.  Take Hycodan cough syrup 1 teaspoon every 4-6 hours as needed.  This is sedating and should not be taken when you are going back to work.  When you return to work you can use some benzonatate cough pills 1-2 pills 3 times daily as needed for daytime coughing.  This is nonsedating.  In the event of getting worse go to the emergency room to get reassessed.   Influenza, Adult Influenza, more commonly known as "the flu," is a viral infection that primarily affects the respiratory tract. The respiratory tract includes organs that help you breathe, such as the lungs, nose, and throat. The flu causes many common cold symptoms, as well as a high fever and body aches. The flu spreads easily from person to person (is contagious). Getting a flu shot (influenza vaccination) every year is the best way to prevent influenza. What are the causes? Influenza is caused by a virus. You can catch the virus by:  Breathing in droplets from an infected person's cough or sneeze.  Touching something that was recently contaminated with the virus and then touching your mouth, nose, or eyes.  What increases the risk? The following factors may make you more likely to get the flu:  Not cleaning your hands frequently with soap and water or alcohol-based hand sanitizer.  Having close contact with many people during cold and flu season.  Touching your mouth, eyes, or nose without washing or sanitizing your hands first.  Not drinking enough fluids or not eating a healthy diet.  Not getting enough sleep or exercise.  Being under a high amount of stress.  Not getting a yearly (annual) flu shot.  You may be at a higher risk of complications from the flu, such as a severe lung infection (pneumonia), if you:  Are over the age of  49.  Are pregnant.  Have a weakened disease-fighting system (immune system). You may have a weakened immune system if you: ? Have HIV or AIDS. ? Are undergoing chemotherapy. ? Aretaking medicines that reduce the activity of (suppress) the immune system.  Have a long-term (chronic) illness, such as heart disease, kidney disease, diabetes, or lung disease.  Have a liver disorder.  Are obese.  Have anemia.  What are the signs or symptoms? Symptoms of this condition typically last 4-10 days and may include:  Fever.  Chills.  Headache, body aches, or muscle aches.  Sore throat.  Cough.  Runny or congested nose.  Chest discomfort and cough.  Poor appetite.  Weakness or tiredness (fatigue).  Dizziness.  Nausea or vomiting.  How is this diagnosed? This condition may be diagnosed based on your medical history and a physical exam. Your health care provider may do a nose or throat swab test to confirm the diagnosis. How is this treated? If influenza is detected early, you can be treated with antiviral medicine that can reduce the length of your illness and the severity of your symptoms. This medicine may be given by mouth (orally) or through an IV tube that is inserted in one of your veins. The goal of treatment is to relieve symptoms by taking care of yourself at home. This may include taking over-the-counter medicines, drinking plenty of fluids, and adding humidity to the air in your home. In some cases, influenza goes away on its own. Severe influenza or complications from influenza may be treated in a hospital. Follow these instructions at home:  Take over-the-counter and prescription medicines only as told by your health care provider.  Use a cool mist humidifier to add humidity to the air in your home. This can make breathing easier.  Rest as needed.  Drink enough fluid to keep your urine clear or pale yellow.  Cover your mouth and nose when you cough or  sneeze.  Wash your hands with soap and water often, especially after you cough or sneeze. If soap and water are not available, use hand sanitizer.  Stay home from work or school as told by your health care provider. Unless you are visiting your health care provider, try to avoid leaving home until your fever has been gone for 24 hours without the use of medicine.  Keep all follow-up visits as told by your health care provider. This is important. How is this prevented?  Getting an annual flu shot is the best way to avoid getting the flu. You may get the flu shot in late summer, fall, or winter. Ask your health care provider  when you should get your flu shot.  Wash your hands often or use hand sanitizer often.  Avoid contact with people who are sick during cold and flu season.  Eat a healthy diet, drink plenty of fluids, get enough sleep, and exercise regularly. Contact a health care provider if:  You develop new symptoms.  You have: ? Chest pain. ? Diarrhea. ? A fever.  Your cough gets worse.  You produce more mucus.  You feel nauseous or you vomit. Get help right away if:  You develop shortness of breath or difficulty breathing.  Your skin or nails turn a bluish color.  You have severe pain or stiffness in your neck.  You develop a sudden headache or sudden pain in your face or ear.  You cannot stop vomiting. This information is not intended to replace advice given to you by your health care provider. Make sure you discuss any questions you have with your health care provider. Document Released: 12/03/2000 Document Revised: 05/13/2016 Document Reviewed: 09/30/2015 Elsevier Interactive Patient Education  2017 [employer].     No Follow-up on file.   Mark Hassey, MD 01/30/2018

## 2018-01-30 NOTE — Patient Instructions (Addendum)
Drink plenty of fluids and get enough rest  Not go to work until you have been afebrile for 24 hours and are feeling better.  The cough may linger.  Use your inhaler medication, albuterol or DuoNeb, as necessary for wheezing  Take Tamiflu 1 twice daily  Take ibuprofen or Tylenol as needed for fever and aching.  Take Hycodan cough syrup 1 teaspoon every 4-6 hours as needed.  This is sedating and should not be taken when you are going back to work.  When you return to work you can use some benzonatate cough pills 1-2 pills 3 times daily as needed for daytime coughing.  This is nonsedating.  In the event of getting worse go to the emergency room to get reassessed.   Influenza, Adult Influenza, more commonly known as "the flu," is a viral infection that primarily affects the respiratory tract. The respiratory tract includes organs that help you breathe, such as the lungs, nose, and throat. The flu causes many common cold symptoms, as well as a high fever and body aches. The flu spreads easily from person to person (is contagious). Getting a flu shot (influenza vaccination) every year is the best way to prevent influenza. What are the causes? Influenza is caused by a virus. You can catch the virus by:  Breathing in droplets from an infected person's cough or sneeze.  Touching something that was recently contaminated with the virus and then touching your mouth, nose, or eyes.  What increases the risk? The following factors may make you more likely to get the flu:  Not cleaning your hands frequently with soap and water or alcohol-based hand sanitizer.  Having close contact with many people during cold and flu season.  Touching your mouth, eyes, or nose without washing or sanitizing your hands first.  Not drinking enough fluids or not eating a healthy diet.  Not getting enough sleep or exercise.  Being under a high amount of stress.  Not getting a yearly (annual) flu shot.  You  may be at a higher risk of complications from the flu, such as a severe lung infection (pneumonia), if you:  Are over the age of 68.  Are pregnant.  Have a weakened disease-fighting system (immune system). You may have a weakened immune system if you: ? Have HIV or AIDS. ? Are undergoing chemotherapy. ? Aretaking medicines that reduce the activity of (suppress) the immune system.  Have a long-term (chronic) illness, such as heart disease, kidney disease, diabetes, or lung disease.  Have a liver disorder.  Are obese.  Have anemia.  What are the signs or symptoms? Symptoms of this condition typically last 4-10 days and may include:  Fever.  Chills.  Headache, body aches, or muscle aches.  Sore throat.  Cough.  Runny or congested nose.  Chest discomfort and cough.  Poor appetite.  Weakness or tiredness (fatigue).  Dizziness.  Nausea or vomiting.  How is this diagnosed? This condition may be diagnosed based on your medical history and a physical exam. Your health care provider may do a nose or throat swab test to confirm the diagnosis. How is this treated? If influenza is detected early, you can be treated with antiviral medicine that can reduce the length of your illness and the severity of your symptoms. This medicine may be given by mouth (orally) or through an IV tube that is inserted in one of your veins. The goal of treatment is to relieve symptoms by taking care of yourself at home.  This may include taking over-the-counter medicines, drinking plenty of fluids, and adding humidity to the air in your home. In some cases, influenza goes away on its own. Severe influenza or complications from influenza may be treated in a hospital. Follow these instructions at home:  Take over-the-counter and prescription medicines only as told by your health care provider.  Use a cool mist humidifier to add humidity to the air in your home. This can make breathing  easier.  Rest as needed.  Drink enough fluid to keep your urine clear or pale yellow.  Cover your mouth and nose when you cough or sneeze.  Wash your hands with soap and water often, especially after you cough or sneeze. If soap and water are not available, use hand sanitizer.  Stay home from work or school as told by your health care provider. Unless you are visiting your health care provider, try to avoid leaving home until your fever has been gone for 24 hours without the use of medicine.  Keep all follow-up visits as told by your health care provider. This is important. How is this prevented?  Getting an annual flu shot is the best way to avoid getting the flu. You may get the flu shot in late summer, fall, or winter. Ask your health care provider when you should get your flu shot.  Wash your hands often or use hand sanitizer often.  Avoid contact with people who are sick during cold and flu season.  Eat a healthy diet, drink plenty of fluids, get enough sleep, and exercise regularly. Contact a health care provider if:  You develop new symptoms.  You have: ? Chest pain. ? Diarrhea. ? A fever.  Your cough gets worse.  You produce more mucus.  You feel nauseous or you vomit. Get help right away if:  You develop shortness of breath or difficulty breathing.  Your skin or nails turn a bluish color.  You have severe pain or stiffness in your neck.  You develop a sudden headache or sudden pain in your face or ear.  You cannot stop vomiting. This information is not intended to replace advice given to you by your health care provider. Make sure you discuss any questions you have with your health care provider. Document Released: 12/03/2000 Document Revised: 05/13/2016 Document Reviewed: 09/30/2015 Elsevier Interactive Patient Education  2017 [employer].

## 2018-03-08 ENCOUNTER — Other Ambulatory Visit: Payer: Self-pay

## 2018-03-15 ENCOUNTER — Ambulatory Visit: Payer: Self-pay | Admitting: Oncology

## 2018-03-31 ENCOUNTER — Inpatient Hospital Stay: Payer: Managed Care, Other (non HMO) | Attending: Oncology

## 2018-03-31 ENCOUNTER — Other Ambulatory Visit: Payer: Self-pay

## 2018-03-31 ENCOUNTER — Other Ambulatory Visit: Payer: Self-pay | Admitting: *Deleted

## 2018-03-31 ENCOUNTER — Telehealth: Payer: Self-pay | Admitting: *Deleted

## 2018-03-31 DIAGNOSIS — E039 Hypothyroidism, unspecified: Secondary | ICD-10-CM | POA: Diagnosis not present

## 2018-03-31 DIAGNOSIS — D472 Monoclonal gammopathy: Secondary | ICD-10-CM | POA: Insufficient documentation

## 2018-03-31 DIAGNOSIS — E876 Hypokalemia: Secondary | ICD-10-CM | POA: Diagnosis not present

## 2018-03-31 DIAGNOSIS — D72829 Elevated white blood cell count, unspecified: Secondary | ICD-10-CM | POA: Diagnosis not present

## 2018-03-31 LAB — BASIC METABOLIC PANEL
ANION GAP: 17 — AB (ref 5–15)
BUN: 10 mg/dL (ref 6–20)
CALCIUM: 9.1 mg/dL (ref 8.9–10.3)
CO2: 21 mmol/L — ABNORMAL LOW (ref 22–32)
Chloride: 97 mmol/L — ABNORMAL LOW (ref 101–111)
Creatinine, Ser: 0.72 mg/dL (ref 0.44–1.00)
GLUCOSE: 90 mg/dL (ref 65–99)
Potassium: 2.5 mmol/L — CL (ref 3.5–5.1)
Sodium: 135 mmol/L (ref 135–145)

## 2018-03-31 LAB — CBC WITH DIFFERENTIAL/PLATELET
BASOS ABS: 0 10*3/uL (ref 0–0.1)
BASOS PCT: 0 %
EOS PCT: 2 %
Eosinophils Absolute: 0.2 10*3/uL (ref 0–0.7)
HCT: 34.7 % — ABNORMAL LOW (ref 35.0–47.0)
Hemoglobin: 12.1 g/dL (ref 12.0–16.0)
LYMPHS PCT: 25 %
Lymphs Abs: 3.4 10*3/uL (ref 1.0–3.6)
MCH: 26.3 pg (ref 26.0–34.0)
MCHC: 35 g/dL (ref 32.0–36.0)
MCV: 75.1 fL — AB (ref 80.0–100.0)
Monocytes Absolute: 1 10*3/uL — ABNORMAL HIGH (ref 0.2–0.9)
Monocytes Relative: 8 %
Neutro Abs: 8.8 10*3/uL — ABNORMAL HIGH (ref 1.4–6.5)
Neutrophils Relative %: 65 %
PLATELETS: 304 10*3/uL (ref 150–440)
RBC: 4.62 MIL/uL (ref 3.80–5.20)
RDW: 17.1 % — ABNORMAL HIGH (ref 11.5–14.5)
WBC: 13.4 10*3/uL — AB (ref 3.6–11.0)

## 2018-03-31 MED ORDER — POTASSIUM CHLORIDE CRYS ER 20 MEQ PO TBCR: 20.0000 meq | EXTENDED_RELEASE_TABLET | Freq: Two times a day (BID) | ORAL | 1 refills | Status: DC

## 2018-03-31 NOTE — Telephone Encounter (Signed)
Please call in 20 meq oral KCl BID and send results to PCP for further evaluation and management.

## 2018-03-31 NOTE — Telephone Encounter (Signed)
RX for potassium 20 meq BID sent to pts preferred pharmacy, [facility] in [place]. Left vm for pt regarding results and new prescription. Results have also been forwarded to Dr. Netty Starring.

## 2018-03-31 NOTE — Telephone Encounter (Signed)
Kim in lab called with critical; K 2.5

## 2018-04-01 LAB — IGG, IGA, IGM
IGA: 949 mg/dL — AB (ref 87–352)
IGM (IMMUNOGLOBULIN M), SRM: 43 mg/dL (ref 26–217)
IgG (Immunoglobin G), Serum: 730 mg/dL (ref 700–1600)

## 2018-04-03 LAB — KAPPA/LAMBDA LIGHT CHAINS
KAPPA FREE LGHT CHN: 53.5 mg/L — AB (ref 3.3–19.4)
Kappa, lambda light chain ratio: 3.93 — ABNORMAL HIGH (ref 0.26–1.65)
LAMDA FREE LIGHT CHAINS: 13.6 mg/L (ref 5.7–26.3)

## 2018-04-03 LAB — PROTEIN ELECTROPHORESIS, SERUM
A/G Ratio: 1 (ref 0.7–1.7)
Albumin ELP: 3.4 g/dL (ref 2.9–4.4)
Alpha-1-Globulin: 0.2 g/dL (ref 0.0–0.4)
Alpha-2-Globulin: 0.7 g/dL (ref 0.4–1.0)
Beta Globulin: 1.6 g/dL — ABNORMAL HIGH (ref 0.7–1.3)
GLOBULIN, TOTAL: 3.4 g/dL (ref 2.2–3.9)
Gamma Globulin: 0.8 g/dL (ref 0.4–1.8)
M-SPIKE, %: 0.6 g/dL — AB
Total Protein ELP: 6.8 g/dL (ref 6.0–8.5)

## 2018-04-07 ENCOUNTER — Encounter: Payer: Self-pay | Admitting: Oncology

## 2018-04-09 NOTE — Progress Notes (Signed)
Allison Martinez  [phone] [phone]  ID: Allison Martinez OB: 1976/05/14  MR#: [id number]  ZJI#:[id number]  Patient Care Team: [name], MD as PCP - General (Family Medicine)  CHIEF COMPLAINT: MGUS, leukocytosis.  INTERVAL HISTORY: Patient returns to clinic today for routine six-month evaluation and discussion of her laboratory work.  She reports an intentional 30 pound weight loss secondary to dietary changes.  She currently feels well and is asymptomatic. She has no neurologic complaints. She denies any recent fevers or illnesses. She denies any chest pain or shortness of breath. She denies any nausea, vomiting, constipation, or diarrhea. She has no urinary complaints.  Patient offers no specific complaints today. REVIEW OF SYSTEMS:   Review of Systems  Constitutional: Positive for weight loss. Negative for fever and malaise/fatigue.  Respiratory: Negative.  Negative for cough and sputum production.   Cardiovascular: Negative.  Negative for chest pain and leg swelling.  Gastrointestinal: Negative.  Negative for abdominal pain.  Genitourinary: Negative.   Musculoskeletal: Negative.  Negative for back pain, joint pain and neck pain.  Skin: Negative.  Negative for rash.  Neurological: Negative.  Negative for sensory change, focal weakness and weakness.  Psychiatric/Behavioral: Negative.  The patient is not nervous/anxious.     As per HPI. Otherwise, a complete review of systems is negative.  PAST MEDICAL HISTORY: Past Medical History:  Diagnosis Date  . Achilles tendonitis   . Anemia   . Anxiety   . Asthma   . Bandemia   . Diverticulitis   . Dysrhythmia   . Fibromyalgia 06/07/2017  . GERD (gastroesophageal reflux disease)   . H/O hiatal hernia   . Headache(784.0)   . Hypertension   . Hypothyroidism   . IBS (irritable bowel syndrome)   . Migraine    complex  . Murmur, cardiac   . Pneumonia     PAST SURGICAL  HISTORY: Past Surgical History:  Procedure Laterality Date  . ANTERIOR CERVICAL DECOMP/DISCECTOMY FUSION    . APPENDECTOMY    . CHOLECYSTECTOMY    . LAPAROSCOPY  12/15/2012   Procedure: LAPAROSCOPY DIAGNOSTIC;  Surgeon: Lovenia Kim, MD;  Location: [facility];  Service: Gynecology;  Laterality: N/A;    FAMILY HISTORY Family History  Problem Relation Age of Onset  . Arrhythmia Mother   . Hypertension Mother   . Heart disease Father        ADVANCED DIRECTIVES:    HEALTH MAINTENANCE: Social History   Tobacco Use  . Smoking status: Current Every Day Smoker    Packs/day: 0.50    Years: 6.00    Pack years: 3.00    Types: Cigarettes  . Smokeless tobacco: Never Used  Substance Use Topics  . Alcohol use: Yes    Comment: occasionally  . Drug use: No     Colonoscopy:  PAP:  Bone density:  Lipid panel:  Allergies  Allergen Reactions  . Fish-Derived Products Anaphylaxis  . Shellfish Allergy Anaphylaxis  . Sulfa Antibiotics Anaphylaxis  . Cefdinir   . Codeine Itching  . Tape   . Tylenol [Acetaminophen]     Cant take tylenol #3    Current Outpatient Medications  Medication Sig Dispense Refill  . albuterol (PROVENTIL HFA;VENTOLIN HFA) 108 (90 BASE) MCG/ACT inhaler Inhale 2 puffs into the lungs every 6 (six) hours as needed. For shortness of breath away from home    . albuterol (PROVENTIL) (2.5 MG/3ML) 0.083% nebulizer solution Take 2.5 mg by nebulization 2 (two) times daily as needed. For  shortness of breath    . b complex vitamins tablet Take 1 tablet by mouth daily.    . bisoprolol (ZEBETA) 5 MG tablet Take 2.5 mg by mouth 2 (two) times daily.     . fluticasone (FLONASE) 50 MCG/ACT nasal spray Use 2 sprays each nostril twice daily for 3 or 4 days, then once daily 16 g 1  . hydrochlorothiazide (HYDRODIURIL) 25 MG tablet Take 25 mg by mouth daily.    Marland Kitchen ipratropium-albuterol (DUONEB) 0.5-2.5 (3) MG/3ML SOLN Take 3 mLs by nebulization every 4 (four) hours as needed. 360  mL 6  . levothyroxine (SYNTHROID, LEVOTHROID) 75 MCG tablet Take 75 mcg by mouth daily.    . nitroGLYCERIN (NITROSTAT) 0.4 MG SL tablet Place under the tongue.    Marland Kitchen omeprazole (PRILOSEC) 20 MG capsule Take 40 mg by mouth daily.     . polyethylene glycol powder (GLYCOLAX/MIRALAX) powder Take as directed for colonic prep.    . potassium chloride SA (K-DUR,KLOR-CON) 20 MEQ tablet Take 1 tablet (20 mEq total) by mouth 2 (two) times daily. 60 tablet 1  . vitamin B-12 (CYANOCOBALAMIN) 500 MCG tablet Take 500 mcg by mouth daily.    Marland Kitchen VITAMIN D, CHOLECALCIFEROL, PO Take 1 capsule by mouth daily.    . benzonatate (TESSALON) 100 MG capsule Take 1-2 capsules (100-200 mg total) by mouth 3 (three) times daily as needed for cough. (Patient not taking: Reported on 04/10/2018) 40 capsule 0  . HYDROcodone-homatropine (HYCODAN) 5-1.5 MG/5ML syrup Take 5 mLs by mouth every 6 (six) hours as needed for cough. (Patient not taking: Reported on 04/10/2018) 120 mL 0  . tranexamic acid (LYSTEDA) 650 MG TABS tablet Take 1,300 mg by mouth 3 (three) times daily.     No current facility-administered medications for this visit.     OBJECTIVE: Vitals:   04/10/18 1114  BP: 118/64  Pulse: 76  Resp: 18  Temp: 98.1 F (36.7 C)     Body mass index is 51.03 kg/m.    ECOG FS:0 - Asymptomatic  General: Well-developed, well-nourished, no acute distress. Eyes: Pink conjunctiva, anicteric sclera. Lungs: Clear to auscultation bilaterally. Heart: Regular rate and rhythm. No rubs, murmurs, or gallops. Abdomen: Soft, nontender, nondistended. No organomegaly noted, normoactive bowel sounds. Musculoskeletal: No edema, cyanosis, or clubbing. Neuro: Alert, answering all questions appropriately. Cranial nerves grossly intact. Skin: No rashes or petechiae noted. Psych: Normal affect.   LAB RESULTS:  Lab Results  Component Value Date   NA 135 03/31/2018   K 2.5 (LL) 03/31/2018   CL 97 (L) 03/31/2018   CO2 21 (L) 03/31/2018    GLUCOSE 90 03/31/2018   BUN 10 03/31/2018   CREATININE 0.72 03/31/2018   CALCIUM 9.1 03/31/2018   PROT 6.9 06/07/2017   ALBUMIN 4.0 06/07/2017   AST 17 06/07/2017   ALT 19 06/07/2017   ALKPHOS 88 06/07/2017   BILITOT 0.2 06/07/2017   GFRNONAA >60 03/31/2018   GFRAA >60 03/31/2018    Lab Results  Component Value Date   WBC 13.4 (H) 03/31/2018   NEUTROABS 8.8 (H) 03/31/2018   HGB 12.1 03/31/2018   HCT 34.7 (L) 03/31/2018   MCV 75.1 (L) 03/31/2018   PLT 304 03/31/2018   Lab Results  Component Value Date   TOTALPROTELP 6.8 03/31/2018   ALBUMINELP 3.4 03/31/2018   A1GS 0.2 03/31/2018   A2GS 0.7 03/31/2018   BETS 1.6 (H) 03/31/2018   GAMS 0.8 03/31/2018   MSPIKE 0.6 (H) 03/31/2018   SPEI Comment 03/31/2018  STUDIES: No results found.  ASSESSMENT: MGUS, leukocytosis.  PLAN:    1. MGUS: Patient's M spike is 0.6 today.  Appears to range from 0.0 to 0.6 over the last year.  Patient's M spike is 0.0 today. Previously, she had an M spike of 0.3.  Her IgA and kappa free light chains both continue to be mildly elevated.  Metastatic bone survey completed on February 17, 2016 reviewed independently with no obvious lesions. She has no evidence of endorgan damage. No intervention is needed. Patient does not require bone marrow biopsy. Return to clinic in 6 months with repeat laboratory work and further evaluation. 2. Leukocytosis: Chronic and unchanged.  Patient's white blood cell count is persistently elevated, but approximately her baseline. Her white count has ranged from 12.2-21.3 since at least September 2001. Previously, BCR-ABL and peripheral blood flow cytometry were negative. 3.  Hypokalemia: Patient was given a prescription for oral potassium supplementation.  This is likely dietary related.  Patient has been instructed to follow-up with her PCP for further evaluation. 4.  Weight loss: Intentional.  Approximately 30 minutes was spent in discussion of which greater than 50%  was consultation.  Patient expressed understanding and was in agreement with this plan. She also understands that She can call clinic at any time with any questions, concerns, or complaints.   Lloyd Huger, MD   04/12/2018 10:03 AM

## 2018-04-10 ENCOUNTER — Inpatient Hospital Stay (HOSPITAL_BASED_OUTPATIENT_CLINIC_OR_DEPARTMENT_OTHER): Payer: Managed Care, Other (non HMO) | Admitting: Oncology

## 2018-04-10 ENCOUNTER — Encounter: Payer: Self-pay | Admitting: Oncology

## 2018-04-10 VITALS — BP 118/64 | HR 76 | Temp 98.1°F | Resp 18 | Wt 270.1 lb

## 2018-04-10 DIAGNOSIS — D72829 Elevated white blood cell count, unspecified: Secondary | ICD-10-CM | POA: Diagnosis not present

## 2018-04-10 DIAGNOSIS — E876 Hypokalemia: Secondary | ICD-10-CM | POA: Diagnosis not present

## 2018-04-10 DIAGNOSIS — E039 Hypothyroidism, unspecified: Secondary | ICD-10-CM | POA: Diagnosis not present

## 2018-04-10 DIAGNOSIS — D472 Monoclonal gammopathy: Secondary | ICD-10-CM | POA: Diagnosis not present

## 2018-04-10 NOTE — Progress Notes (Signed)
Pt in for follow up and test results.  Has last 31 lbs doing the Keto diet.  Denies any difficulties or concerns.

## 2018-10-09 ENCOUNTER — Inpatient Hospital Stay: Payer: Managed Care, Other (non HMO) | Attending: Oncology

## 2018-10-09 ENCOUNTER — Inpatient Hospital Stay: Payer: Managed Care, Other (non HMO)

## 2018-10-09 DIAGNOSIS — D472 Monoclonal gammopathy: Secondary | ICD-10-CM | POA: Diagnosis present

## 2018-10-09 LAB — CBC WITH DIFFERENTIAL/PLATELET
Abs Immature Granulocytes: 0.03 10*3/uL (ref 0.00–0.07)
Basophils Absolute: 0 10*3/uL (ref 0.0–0.1)
Basophils Relative: 0 %
EOS ABS: 0.2 10*3/uL (ref 0.0–0.5)
EOS PCT: 2 %
HEMATOCRIT: 32.8 % — AB (ref 36.0–46.0)
HEMOGLOBIN: 11.5 g/dL — AB (ref 12.0–15.0)
Immature Granulocytes: 0 %
LYMPHS ABS: 4.8 10*3/uL — AB (ref 0.7–4.0)
LYMPHS PCT: 39 %
MCH: 27.7 pg (ref 26.0–34.0)
MCHC: 35.1 g/dL (ref 30.0–36.0)
MCV: 79 fL — AB (ref 80.0–100.0)
MONO ABS: 0.7 10*3/uL (ref 0.1–1.0)
MONOS PCT: 6 %
Neutro Abs: 6.6 10*3/uL (ref 1.7–7.7)
Neutrophils Relative %: 53 %
Platelets: 239 10*3/uL (ref 150–400)
RBC: 4.15 MIL/uL (ref 3.87–5.11)
RDW: 15.3 % (ref 11.5–15.5)
WBC: 12.4 10*3/uL — ABNORMAL HIGH (ref 4.0–10.5)
nRBC: 0 % (ref 0.0–0.2)

## 2018-10-10 LAB — IGG, IGA, IGM
IGG (IMMUNOGLOBIN G), SERUM: 685 mg/dL — AB (ref 700–1600)
IgA: 698 mg/dL — ABNORMAL HIGH (ref 87–352)
IgM (Immunoglobulin M), Srm: 30 mg/dL (ref 26–217)

## 2018-10-10 LAB — KAPPA/LAMBDA LIGHT CHAINS
KAPPA, LAMDA LIGHT CHAIN RATIO: 4.54 — AB (ref 0.26–1.65)
Kappa free light chain: 46.8 mg/L — ABNORMAL HIGH (ref 3.3–19.4)
Lambda free light chains: 10.3 mg/L (ref 5.7–26.3)

## 2018-10-11 LAB — PROTEIN ELECTROPHORESIS, SERUM
A/G RATIO SPE: 1.3 (ref 0.7–1.7)
ALBUMIN ELP: 3.7 g/dL (ref 2.9–4.4)
ALPHA-1-GLOBULIN: 0.2 g/dL (ref 0.0–0.4)
ALPHA-2-GLOBULIN: 0.7 g/dL (ref 0.4–1.0)
Beta Globulin: 1.3 g/dL (ref 0.7–1.3)
Gamma Globulin: 0.6 g/dL (ref 0.4–1.8)
Globulin, Total: 2.8 g/dL (ref 2.2–3.9)
M-Spike, %: 0.7 g/dL — ABNORMAL HIGH
Total Protein ELP: 6.5 g/dL (ref 6.0–8.5)

## 2018-10-12 ENCOUNTER — Encounter: Payer: Self-pay | Admitting: Oncology

## 2018-10-16 ENCOUNTER — Inpatient Hospital Stay: Payer: Managed Care, Other (non HMO) | Admitting: Oncology

## 2018-10-17 ENCOUNTER — Other Ambulatory Visit: Payer: Self-pay | Admitting: *Deleted

## 2018-10-17 ENCOUNTER — Encounter: Payer: Self-pay | Admitting: Oncology

## 2018-11-06 ENCOUNTER — Encounter: Payer: Self-pay | Admitting: Emergency Medicine

## 2018-11-06 ENCOUNTER — Ambulatory Visit: Payer: Self-pay | Admitting: Emergency Medicine

## 2018-11-06 VITALS — BP 118/74 | HR 53 | Temp 98.7°F | Ht 60.0 in | Wt 210.0 lb

## 2018-11-06 DIAGNOSIS — R059 Cough, unspecified: Secondary | ICD-10-CM

## 2018-11-06 DIAGNOSIS — J4521 Mild intermittent asthma with (acute) exacerbation: Secondary | ICD-10-CM

## 2018-11-06 DIAGNOSIS — R05 Cough: Secondary | ICD-10-CM

## 2018-11-06 LAB — POCT INFLUENZA A/B
Influenza A, POC: NEGATIVE
Influenza B, POC: NEGATIVE

## 2018-11-06 MED ORDER — PREDNISONE 10 MG (21) PO TBPK: ORAL_TABLET | ORAL | 0 refills | Status: DC

## 2018-11-06 MED ORDER — AMOXICILLIN 875 MG PO TABS: ORAL_TABLET | ORAL | 0 refills | Status: DC

## 2018-11-06 MED ORDER — PROMETHAZINE-DM 6.25-15 MG/5ML PO SYRP: ORAL_SOLUTION | ORAL | 0 refills | Status: DC

## 2018-11-06 NOTE — Progress Notes (Addendum)
Subjective:     Patient ID: Allison Martinez, female   DOB: [birth year], 42 y.o.   MRN: [id number]  Cough     Allison Martinez is a 42 y.o. female who complains of 3 days of progressively worsening URI symptoms.  Sinus congestion, discolored rhinorrhea, progressed to cough productive of discolored sputum.  Mild increase wheezing.  She has mild asthma and is been using albuterol rescue inhaler for wheezing which helps. Other symptoms include fever chills and mild myalgias.  No rash.   Have been using over-the-counter treatment which helps a little bit.-Tylenol helped the fever, last took a few hours ago. She states states that in the past, she's needed an antibiotic and prednisone every fall for this type of flareup.  + mild chills/sweats +  Fever +  Nasal congestion +  Discolored Post-nasal drainage Mild sinus pain/pressure No sore throat +  cough, productive of discolored sputum.  Cough can keep her up at night sometimes. Positive wheezing Positive chest congestion No hemoptysis No shortness of breath No pleuritic pain  No itchy/red eyes No earache  No nausea No vomiting No abdominal pain No diarrhea  No skin rashes +  Fatigue No myalgias No headache  LMP 2 days ago.  She denies chance of pregnancy.   Review of Systems  Respiratory: Positive for cough.   All other systems reviewed and are negative.      Objective: Physical Exam  Constitutional: She is oriented to person, place, and time. She appears well-developed and well-nourished. No distress.  HENT:  Head: Normocephalic and atraumatic.  Right Ear: Tympanic membrane, external ear and ear canal normal.  Left Ear: Tympanic membrane, external ear and ear canal normal.  Nose: Mucosal edema and rhinorrhea present. Right sinus exhibits mild maxillary sinus tenderness. Left sinus exhibits mild maxillary sinus tenderness.  Mouth/Throat: Oropharynx is clear and moist.  Minimal injection of postnasal drainage. no oral  lesions. No oropharyngeal exudate.  Eyes:  no discharge. No scleral icterus.  Neck: Neck supple. Minimal shotty, movable anterior cervical nodes. Cardiovascular: Normal rate, regular rhythm and normal heart sounds.  Pulmonary/Chest: Effort normal. She has mild late expiratory wheezes, exacerbated by forced expiration.  She has rhonchi diffusely.  Breath sounds equal bilaterally with fairly good air movement bilaterally.  She has no rales.  neurological: She is alert and oriented to person, place, and time.  Skin: Skin is warm and dry. No rash Psychiatric: She has a normal mood and affect.  Nursing note and vitals reviewed. -Temp 98.7, O2 saturation 96% room air  Rapid flu tests today negative for influenza A and B           Assessment:     Likely has acute maxillary sinusitis/bronchitis with mild asthma exacerbation.    Plan: Treatment options discussed, as well as risks, benefits, alternatives. Patient voiced understanding and agreement with the following plans:   New Prescriptions   AMOXICILLIN (AMOXIL) 875 MG TABLET    Take 1 twice a day X 10 days.   PREDNISONE (STERAPRED UNI-PAK 21 TAB) 10 MG (21) TBPK TABLET    Take tapering dosage over 6 days as directed   PROMETHAZINE-DEXTROMETHORPHAN (PROMETHAZINE-DM) 6.25-15 MG/5ML SYRUP    1 or 2 teaspoons every 6 hours as needed for cough.  May cause drowsiness  (Reviewed her medication allergy history. Cefdinir has caused rash in the past but not anaphylaxis.  She has taken amoxicillin without any problems in the past.) Other symptomatic care discussed.-May use rescue albuterol inhaler as needed for  acute wheezing, but not for chronic use. Follow-up with your primary care doctor in 5-7 days if not improving, or sooner if symptoms become worse. Precautions discussed. Red flags discussed. Questions invited and answered. Patient voiced understanding and agreement.

## 2019-04-04 ENCOUNTER — Inpatient Hospital Stay: Payer: Managed Care, Other (non HMO)

## 2019-04-11 ENCOUNTER — Ambulatory Visit: Payer: Managed Care, Other (non HMO) | Admitting: Oncology

## 2019-05-16 ENCOUNTER — Inpatient Hospital Stay: Payer: Managed Care, Other (non HMO)

## 2019-05-16 ENCOUNTER — Other Ambulatory Visit: Payer: Self-pay | Admitting: Oncology

## 2019-05-16 ENCOUNTER — Inpatient Hospital Stay: Payer: Managed Care, Other (non HMO) | Attending: Oncology

## 2019-05-16 ENCOUNTER — Other Ambulatory Visit: Payer: Self-pay

## 2019-05-16 DIAGNOSIS — E876 Hypokalemia: Secondary | ICD-10-CM | POA: Diagnosis not present

## 2019-05-16 DIAGNOSIS — E039 Hypothyroidism, unspecified: Secondary | ICD-10-CM | POA: Insufficient documentation

## 2019-05-16 DIAGNOSIS — D472 Monoclonal gammopathy: Secondary | ICD-10-CM

## 2019-05-16 DIAGNOSIS — D72829 Elevated white blood cell count, unspecified: Secondary | ICD-10-CM | POA: Insufficient documentation

## 2019-05-16 LAB — CBC WITH DIFFERENTIAL/PLATELET
Abs Immature Granulocytes: 0.06 10*3/uL (ref 0.00–0.07)
Basophils Absolute: 0 10*3/uL (ref 0.0–0.1)
Basophils Relative: 0 %
Eosinophils Absolute: 0.4 10*3/uL (ref 0.0–0.5)
Eosinophils Relative: 3 %
HCT: 35.5 % — ABNORMAL LOW (ref 36.0–46.0)
Hemoglobin: 12.7 g/dL (ref 12.0–15.0)
Immature Granulocytes: 0 %
Lymphocytes Relative: 34 %
Lymphs Abs: 4.7 10*3/uL — ABNORMAL HIGH (ref 0.7–4.0)
MCH: 29.5 pg (ref 26.0–34.0)
MCHC: 35.8 g/dL (ref 30.0–36.0)
MCV: 82.6 fL (ref 80.0–100.0)
Monocytes Absolute: 0.8 10*3/uL (ref 0.1–1.0)
Monocytes Relative: 6 %
Neutro Abs: 7.6 10*3/uL (ref 1.7–7.7)
Neutrophils Relative %: 57 %
Platelets: 255 10*3/uL (ref 150–400)
RBC: 4.3 MIL/uL (ref 3.87–5.11)
RDW: 13.9 % (ref 11.5–15.5)
WBC: 13.5 10*3/uL — ABNORMAL HIGH (ref 4.0–10.5)
nRBC: 0 % (ref 0.0–0.2)

## 2019-05-16 LAB — BASIC METABOLIC PANEL
Anion gap: 10 (ref 5–15)
BUN: 13 mg/dL (ref 6–20)
CO2: 24 mmol/L (ref 22–32)
Calcium: 9.1 mg/dL (ref 8.9–10.3)
Chloride: 103 mmol/L (ref 98–111)
Creatinine, Ser: 0.65 mg/dL (ref 0.44–1.00)
GFR calc Af Amer: >60 mL/min (ref >60)
GFR calc non Af Amer: >60 mL/min (ref >60)
Glucose, Bld: 92 mg/dL (ref 70–99)
Potassium: 3.4 mmol/L — ABNORMAL LOW (ref 3.5–5.1)
Sodium: 137 mmol/L (ref 135–145)

## 2019-05-17 LAB — PROTEIN ELECTROPHORESIS, SERUM
A/G Ratio: 1.3 (ref 0.7–1.7)
Albumin ELP: 3.6 g/dL (ref 2.9–4.4)
Alpha-1-Globulin: 0.2 g/dL (ref 0.0–0.4)
Alpha-2-Globulin: 0.6 g/dL (ref 0.4–1.0)
Beta Globulin: 1.2 g/dL (ref 0.7–1.3)
Gamma Globulin: 0.6 g/dL (ref 0.4–1.8)
Globulin, Total: 2.7 g/dL (ref 2.2–3.9)
M-Spike, %: 0.5 g/dL — ABNORMAL HIGH
Total Protein ELP: 6.3 g/dL (ref 6.0–8.5)

## 2019-05-17 LAB — KAPPA/LAMBDA LIGHT CHAINS
Kappa free light chain: 35.6 mg/L — ABNORMAL HIGH (ref 3.3–19.4)
Kappa, lambda light chain ratio: 4.51 — ABNORMAL HIGH (ref 0.26–1.65)
Lambda free light chains: 7.9 mg/L (ref 5.7–26.3)

## 2019-05-17 LAB — IGG, IGA, IGM
IgA: 645 mg/dL — ABNORMAL HIGH (ref 87–352)
IgG (Immunoglobin G), Serum: 671 mg/dL (ref 586–1602)
IgM (Immunoglobulin M), Srm: 31 mg/dL (ref 26–217)

## 2019-05-21 LAB — COMP PANEL: LEUKEMIA/LYMPHOMA

## 2019-05-25 ENCOUNTER — Telehealth: Payer: Self-pay | Admitting: Oncology

## 2019-05-27 NOTE — Progress Notes (Signed)
Allison  [phone] [phone]  ID: Allison Martinez OB: 1976-11-29  MR#: [id number]  MHD#:[id number]  Patient Care Team: [name], MD as PCP - General (Family Medicine)  I connected with Allison Martinez on 05/29/19 at  2:30 PM EDT by video enabled telemedicine visit and verified that I am speaking with the correct person using two identifiers.   I discussed the limitations, risks, security and privacy concerns of performing an evaluation and management service by telemedicine and the availability of in-person appointments. I also discussed with the patient that there may be a patient responsible charge related to this service. The patient expressed understanding and agreed to proceed.   Other persons participating in the visit and their role in the encounter: Patient, MD  Patient's location: Home Provider's location: Clinic  CHIEF COMPLAINT: MGUS, leukocytosis.  INTERVAL HISTORY: Patient agreed to video enabled telemedicine visit for routine 55-monthevaluation and discussion of her laboratory work.  She continues to have intentional weight loss.  She currently feels well and is asymptomatic.  She has no neurologic complaints. She denies any recent fevers or illnesses.  She denies any chest pain, shortness of breath, cough, or hemoptysis.  She denies any nausea, vomiting, constipation, or diarrhea. She has no urinary complaints.  Patient feels at her baseline offers no specific complaints today.    REVIEW OF SYSTEMS:   Review of Systems  Constitutional: Positive for weight loss. Negative for fever and malaise/fatigue.  Respiratory: Negative.  Negative for cough and sputum production.   Cardiovascular: Negative.  Negative for chest pain and leg swelling.  Gastrointestinal: Negative.  Negative for abdominal pain.  Genitourinary: Negative.   Musculoskeletal: Negative.  Negative for back pain, joint pain and neck pain.  Skin:  Negative.  Negative for rash.  Neurological: Negative.  Negative for sensory change, focal weakness and weakness.  Psychiatric/Behavioral: Negative.  The patient is not nervous/anxious.     As per HPI. Otherwise, a complete review of systems is negative.  PAST MEDICAL HISTORY: Past Medical History:  Diagnosis Date  . Achilles tendonitis   . Anemia   . Anxiety   . Asthma   . Bandemia   . Diverticulitis   . Dysrhythmia   . Fibromyalgia 06/07/2017  . GERD (gastroesophageal reflux disease)   . H/O hiatal hernia   . Headache(784.0)   . Hypertension   . Hypothyroidism   . IBS (irritable bowel syndrome)   . Migraine    complex  . Murmur, cardiac   . Pneumonia     PAST SURGICAL HISTORY: Past Surgical History:  Procedure Laterality Date  . ANTERIOR CERVICAL DECOMP/DISCECTOMY FUSION    . APPENDECTOMY    . CHOLECYSTECTOMY    . LAPAROSCOPY  12/15/2012   Procedure: LAPAROSCOPY DIAGNOSTIC;  Surgeon: RLovenia Kim MD;  [facility];  Service: Gynecology;  Laterality: N/A;    FAMILY HISTORY Family History  Problem Relation Age of Onset  . Arrhythmia Mother   . Hypertension Mother   . Heart disease Father        ADVANCED DIRECTIVES:    HEALTH MAINTENANCE: Social History   Tobacco Use  . Smoking status: Current Every Day Smoker    Packs/day: 0.50    Years: 6.00    Pack years: 3.00    Types: Cigarettes  . Smokeless tobacco: Never Used  Substance Use Topics  . Alcohol use: Yes    Comment: occasionally  . Drug use: No     Colonoscopy:  PAP:  Bone density:  Lipid panel:  Allergies  Allergen Reactions  . Fish-Derived Products Anaphylaxis  . Shellfish Allergy Anaphylaxis  . Sulfa Antibiotics Anaphylaxis  . Codeine Itching  . Tape   . Tylenol [Acetaminophen]     Cant take tylenol #3  . Cefdinir Rash    Can take amoxicillin without problems    Current Outpatient Medications  Medication Sig Dispense Refill  . albuterol (PROVENTIL HFA;VENTOLIN HFA)  108 (90 BASE) MCG/ACT inhaler Inhale 2 puffs into the lungs every 6 (six) hours as needed. For shortness of breath away from home    . albuterol (PROVENTIL) (2.5 MG/3ML) 0.083% nebulizer solution Take 2.5 mg by nebulization 2 (two) times daily as needed. For shortness of breath    . amoxicillin (AMOXIL) 875 MG tablet Take 1 twice a day X 10 days. 20 tablet 0  . b complex vitamins tablet Take 1 tablet by mouth daily.    . bisoprolol (ZEBETA) 5 MG tablet Take 2.5 mg by mouth 2 (two) times daily.     . carisoprodol (SOMA) 350 MG tablet carisoprodol 350 mg tablet  TAKE 1 TABLET BY MOUTH AT BEDTIME FOR NECK    . hydrochlorothiazide (HYDRODIURIL) 25 MG tablet Take 25 mg by mouth daily.    Marland Kitchen ipratropium-albuterol (DUONEB) 0.5-2.5 (3) MG/3ML SOLN Take 3 mLs by nebulization every 4 (four) hours as needed. 360 mL 6  . levothyroxine (SYNTHROID, LEVOTHROID) 75 MCG tablet Take 75 mcg by mouth daily.    . nitroGLYCERIN (NITROSTAT) 0.4 MG SL tablet Place under the tongue.    Marland Kitchen omeprazole (PRILOSEC) 20 MG capsule Take 40 mg by mouth daily.     . polyethylene glycol powder (GLYCOLAX/MIRALAX) powder Take as directed for colonic prep.    . potassium chloride SA (K-DUR,KLOR-CON) 20 MEQ tablet Take 1 tablet (20 mEq total) by mouth 2 (two) times daily. 60 tablet 1  . predniSONE (STERAPRED UNI-PAK 21 TAB) 10 MG (21) TBPK tablet Take tapering dosage over 6 days as directed 21 tablet 0  . promethazine-dextromethorphan (PROMETHAZINE-DM) 6.25-15 MG/5ML syrup 1 or 2 teaspoons every 6 hours as needed for cough.  May cause drowsiness 118 mL 0  . tranexamic acid (LYSTEDA) 650 MG TABS tablet Take 1,300 mg by mouth 3 (three) times daily.    . vitamin B-12 (CYANOCOBALAMIN) 500 MCG tablet Take 500 mcg by mouth daily.    Marland Kitchen VITAMIN D, CHOLECALCIFEROL, PO Take 1 capsule by mouth daily.     No current facility-administered medications for this visit.     OBJECTIVE: There were no vitals filed for this visit.   There is no height or  weight on file to calculate BMI.    ECOG FS:0 - Asymptomatic  General: Well-developed, well-nourished, no acute distress. HEENT: Normocephalic. Neuro: Alert, answering all questions appropriately. Cranial nerves grossly intact. Skin: No rashes or petechiae noted. Psych: Normal affect.   LAB RESULTS:  Lab Results  Component Value Date   NA 137 05/16/2019   K 3.4 (L) 05/16/2019   CL 103 05/16/2019   CO2 24 05/16/2019   GLUCOSE 92 05/16/2019   BUN 13 05/16/2019   CREATININE 0.65 05/16/2019   CALCIUM 9.1 05/16/2019   PROT 6.9 06/07/2017   ALBUMIN 4.0 06/07/2017   AST 17 06/07/2017   ALT 19 06/07/2017   ALKPHOS 88 06/07/2017   BILITOT 0.2 06/07/2017   GFRNONAA >60 05/16/2019   GFRAA >60 05/16/2019    Lab Results  Component Value Date   WBC 13.5 (H)  05/16/2019   NEUTROABS 7.6 05/16/2019   HGB 12.7 05/16/2019   HCT 35.5 (L) 05/16/2019   MCV 82.6 05/16/2019   PLT 255 05/16/2019   Lab Results  Component Value Date   TOTALPROTELP 6.3 05/16/2019   ALBUMINELP 3.6 05/16/2019   A1GS 0.2 05/16/2019   A2GS 0.6 05/16/2019   BETS 1.2 05/16/2019   GAMS 0.6 05/16/2019   MSPIKE 0.5 (H) 05/16/2019   SPEI Comment 05/16/2019     STUDIES: No results found.  ASSESSMENT: MGUS, leukocytosis.  PLAN:    1. MGUS: Patient's most recent M spike is 0.5.  This is essentially unchanged from February 2017 where her M spike was reported at 0.3.  Her IgA and kappa free light chains both continue to be mildly elevated, but essentially unchanged.  Metastatic bone survey completed on February 17, 2016 reviewed independently with no obvious lesions.  She continues to have no evidence of endorgan damage.  No intervention is needed at this time.  Patient does not require bone marrow biopsy.  Return to clinic in 6 months for laboratory work only and then in 1 year for laboratory work and further evaluation.  If her M spike remained stable at that time, she likely can be transitioned to yearly  laboratory work and evaluation.  2. Leukocytosis: Chronic and unchanged.  Patient's white blood cell count is persistently elevated, but approximately her baseline. Her white count has ranged from 12.2-21.3 since at least September 2001. Previously, BCR-ABL and peripheral blood flow cytometry were negative.  Repeat peripheral blood flow cytometry is negative as well. 3.  Hypokalemia: Improved with oral potassium supplementation. 4.  Weight loss: Intentional.  I provided 15 minutes of face-to-face video visit time during this encounter, and > 50% was spent counseling as documented under my assessment & plan.   Patient expressed understanding and was in agreement with this plan. She also understands that She can call clinic at any time with any questions, concerns, or complaints.   Lloyd Huger, MD   05/29/2019 7:12 AM

## 2019-05-28 ENCOUNTER — Inpatient Hospital Stay: Payer: Managed Care, Other (non HMO) | Attending: Oncology | Admitting: Oncology

## 2019-05-28 DIAGNOSIS — Z79899 Other long term (current) drug therapy: Secondary | ICD-10-CM

## 2019-05-28 DIAGNOSIS — F17210 Nicotine dependence, cigarettes, uncomplicated: Secondary | ICD-10-CM | POA: Diagnosis not present

## 2019-05-28 DIAGNOSIS — D472 Monoclonal gammopathy: Secondary | ICD-10-CM | POA: Diagnosis not present

## 2019-05-28 DIAGNOSIS — I10 Essential (primary) hypertension: Secondary | ICD-10-CM

## 2019-05-28 DIAGNOSIS — E039 Hypothyroidism, unspecified: Secondary | ICD-10-CM | POA: Diagnosis not present

## 2019-10-10 ENCOUNTER — Other Ambulatory Visit: Payer: Self-pay

## 2019-10-11 ENCOUNTER — Encounter: Payer: Self-pay | Admitting: Oncology

## 2019-10-17 ENCOUNTER — Ambulatory Visit: Payer: Managed Care, Other (non HMO) | Admitting: Oncology

## 2019-11-06 ENCOUNTER — Other Ambulatory Visit: Payer: Self-pay

## 2019-11-06 DIAGNOSIS — Z20822 Contact with and (suspected) exposure to covid-19: Secondary | ICD-10-CM

## 2019-11-08 ENCOUNTER — Telehealth: Payer: Self-pay | Admitting: *Deleted

## 2019-11-08 LAB — NOVEL CORONAVIRUS, NAA: SARS-CoV-2, NAA: NOT DETECTED

## 2019-11-08 NOTE — Telephone Encounter (Signed)
Patient called for results,advised results are still pending and to call back'

## 2019-11-27 ENCOUNTER — Other Ambulatory Visit: Payer: Self-pay

## 2019-11-27 ENCOUNTER — Inpatient Hospital Stay: Payer: Managed Care, Other (non HMO) | Attending: Oncology

## 2019-11-27 DIAGNOSIS — D472 Monoclonal gammopathy: Secondary | ICD-10-CM | POA: Diagnosis present

## 2019-11-27 LAB — CBC WITH DIFFERENTIAL/PLATELET
Abs Immature Granulocytes: 0.07 10*3/uL (ref 0.00–0.07)
Basophils Absolute: 0 10*3/uL (ref 0.0–0.1)
Basophils Relative: 0 %
Eosinophils Absolute: 0.4 10*3/uL (ref 0.0–0.5)
Eosinophils Relative: 3 %
HCT: 37.4 % (ref 36.0–46.0)
Hemoglobin: 13 g/dL (ref 12.0–15.0)
Immature Granulocytes: 0 %
Lymphocytes Relative: 29 %
Lymphs Abs: 4.6 10*3/uL — ABNORMAL HIGH (ref 0.7–4.0)
MCH: 29.7 pg (ref 26.0–34.0)
MCHC: 34.8 g/dL (ref 30.0–36.0)
MCV: 85.6 fL (ref 80.0–100.0)
Monocytes Absolute: 0.9 10*3/uL (ref 0.1–1.0)
Monocytes Relative: 6 %
Neutro Abs: 9.8 10*3/uL — ABNORMAL HIGH (ref 1.7–7.7)
Neutrophils Relative %: 62 %
Platelets: 288 10*3/uL (ref 150–400)
RBC: 4.37 MIL/uL (ref 3.87–5.11)
RDW: 13.2 % (ref 11.5–15.5)
WBC: 15.8 10*3/uL — ABNORMAL HIGH (ref 4.0–10.5)
nRBC: 0 % (ref 0.0–0.2)

## 2019-11-27 LAB — BASIC METABOLIC PANEL
Anion gap: 8 (ref 5–15)
BUN: 15 mg/dL (ref 6–20)
CO2: 24 mmol/L (ref 22–32)
Calcium: 9 mg/dL (ref 8.9–10.3)
Chloride: 103 mmol/L (ref 98–111)
Creatinine, Ser: 0.7 mg/dL (ref 0.44–1.00)
GFR calc Af Amer: >60 mL/min (ref >60)
GFR calc non Af Amer: >60 mL/min (ref >60)
Glucose, Bld: 100 mg/dL — ABNORMAL HIGH (ref 70–99)
Potassium: 3.4 mmol/L — ABNORMAL LOW (ref 3.5–5.1)
Sodium: 135 mmol/L (ref 135–145)

## 2019-11-28 LAB — PROTEIN ELECTROPHORESIS, SERUM
A/G Ratio: 1.4 (ref 0.7–1.7)
Albumin ELP: 3.9 g/dL (ref 2.9–4.4)
Alpha-1-Globulin: 0.2 g/dL (ref 0.0–0.4)
Alpha-2-Globulin: 0.7 g/dL (ref 0.4–1.0)
Beta Globulin: 1.2 g/dL (ref 0.7–1.3)
Gamma Globulin: 0.6 g/dL (ref 0.4–1.8)
Globulin, Total: 2.8 g/dL (ref 2.2–3.9)
Total Protein ELP: 6.7 g/dL (ref 6.0–8.5)

## 2019-11-28 LAB — KAPPA/LAMBDA LIGHT CHAINS
Kappa free light chain: 44.3 mg/L — ABNORMAL HIGH (ref 3.3–19.4)
Kappa, lambda light chain ratio: 4.61 — ABNORMAL HIGH (ref 0.26–1.65)
Lambda free light chains: 9.6 mg/L (ref 5.7–26.3)

## 2019-11-28 LAB — IGG, IGA, IGM
IgA: 603 mg/dL — ABNORMAL HIGH (ref 87–352)
IgG (Immunoglobin G), Serum: 679 mg/dL (ref 586–1602)
IgM (Immunoglobulin M), Srm: 30 mg/dL (ref 26–217)

## 2019-11-29 LAB — PROTEIN ELECTRO, RANDOM URINE
Albumin ELP, Urine: 0 %
Alpha-1-Globulin, U: 0 %
Alpha-2-Globulin, U: 0 %
Beta Globulin, U: 0 %
Gamma Globulin, U: 0 %
Total Protein, Urine: <4 mg/dL

## 2019-11-30 ENCOUNTER — Encounter: Payer: Self-pay | Admitting: Oncology

## 2020-01-25 ENCOUNTER — Ambulatory Visit: Payer: Managed Care, Other (non HMO) | Attending: Internal Medicine

## 2020-01-25 DIAGNOSIS — Z20822 Contact with and (suspected) exposure to covid-19: Secondary | ICD-10-CM

## 2020-01-27 LAB — NOVEL CORONAVIRUS, NAA: SARS-CoV-2, NAA: NOT DETECTED

## 2020-02-01 ENCOUNTER — Other Ambulatory Visit: Payer: Self-pay | Admitting: Obstetrics & Gynecology

## 2020-02-01 ENCOUNTER — Other Ambulatory Visit: Payer: Self-pay

## 2020-02-01 ENCOUNTER — Other Ambulatory Visit: Payer: Managed Care, Other (non HMO)

## 2020-02-01 DIAGNOSIS — D699 Hemorrhagic condition, unspecified: Secondary | ICD-10-CM

## 2020-02-08 ENCOUNTER — Other Ambulatory Visit: Payer: Self-pay | Admitting: Obstetrics & Gynecology

## 2020-02-12 LAB — COAG STUDIES INTERP REPORT

## 2020-02-12 LAB — FACTOR 2 ASSAY: Factor II Activity: 131 % (ref 50–154)

## 2020-02-12 LAB — FACTOR II INHIBITOR ASSAY
APTT: 25.8 s
Prothrombin Time: 10.9 s

## 2020-02-12 LAB — VON WILLEBRAND PANEL
Factor VIII Activity: 164 % — ABNORMAL HIGH (ref 56–140)
Von Willebrand Ag: 141 % (ref 50–200)
Von Willebrand Factor: 97 % (ref 50–200)

## 2020-02-12 LAB — FACTOR 10 ASSAY: Factor X Activity: 148 % (ref 76–183)

## 2020-02-12 LAB — FACTOR 9 ASSAY: Factor IX Activity: 116 % (ref 60–177)

## 2020-02-18 ENCOUNTER — Encounter
Admission: RE | Admit: 2020-02-18 | Discharge: 2020-02-18 | Disposition: A | Discharge status: Home / Self Care | Payer: Managed Care, Other (non HMO) | Source: Ambulatory Visit | Attending: Obstetrics & Gynecology | Admitting: Obstetrics & Gynecology

## 2020-02-18 DIAGNOSIS — Z01812 Encounter for preprocedural laboratory examination: Secondary | ICD-10-CM | POA: Insufficient documentation

## 2020-02-18 DIAGNOSIS — Z20822 Contact with and (suspected) exposure to covid-19: Secondary | ICD-10-CM | POA: Insufficient documentation

## 2020-02-18 HISTORY — DX: Unspecified convulsions: R56.9

## 2020-02-18 HISTORY — DX: Nonrheumatic mitral (valve) prolapse: I34.1

## 2020-02-18 HISTORY — DX: Other complications of anesthesia, initial encounter: T88.59XA

## 2020-02-18 HISTORY — DX: Unspecified osteoarthritis, unspecified site: M19.90

## 2020-02-18 HISTORY — DX: Monoclonal gammopathy: D47.2

## 2020-02-18 HISTORY — DX: Migraine, unspecified, not intractable, without status migrainosus: G43.909

## 2020-02-18 NOTE — Pre-Procedure Instructions (Signed)
Progress Notes - documented in this encounter [name], MD - 01/11/2020 12:30 PM EST Formatting of this note might be different from the original. Chief Complaint  Patient presents with  . Follow-up   HPI  Allison Martinez is a 44 y.o. here for follow up of chronic medical issues  HTN: No acute issues. Tolerating meds without adverse effects. Home bp <140s/90s. Denies HAs, vision changes, dizziness, cp, or syncopal episodes.  HLD: No acute issues. Tolerating medications without adverse effects. Denies any myalgia.   Obesity: Aware of weight issue. No specific weight loss regimen.  Tobacco dependence: No acute issues. Still smoking greater than half a pack per day. Wants to quit. Open to using Chantix.  Mild intermittent asthma: Stable per patient. No recent exacerbations.  ROS Review of systems is unremarkable for any active cardiac, respiratory, GI, GU, hematologic, neurologic, dermatologic, HEENT, or psychiatric symptoms except as noted above. No fevers, chills, or constitutional symptoms.   Current Outpatient Medications: albuterol 90 mcg/actuation inhaler, Inhale 2 inhalations into the lungs every 4 (four) hours as needed for Wheezing or Shortness of Breath, Taking bisoprolol (ZEBETA) 5 MG tablet, Take 1/2 (one-half) tablet by mouth once daily, Taking cholecalciferol, vitamin D3, (VITAMIN D3) 5,000 unit Tab, Take 1 tablet by mouth once daily., Taking cyanocobalamin (VITAMIN B12) 1000 MCG tablet, Take 1,000 mcg by mouth once daily., Taking fluticasone propionate (FLONASE) 50 mcg/actuation nasal spray, Place 2 sprays into both nostrils once daily, Taking hydroCHLOROthiazide (HYDRODIURIL) 25 MG tablet, Take 1 tablet (25 mg total) by mouth once daily, Taking levothyroxine (SYNTHROID) 75 MCG tablet, Take 1 tablet (75 mcg total) by mouth once daily Take on an empty stomach with a glass of water at least 30-60 minutes before breakfast., Taking omeprazole (PRILOSEC) 40 MG DR  capsule, Take 1 capsule by mouth once daily, Taking potassium chloride (K-DUR,KLOR-CON) 20 MEQ ER tablet, Take 1 tablet (20 mEq total) by mouth 2 (two) times daily (Patient taking differently: Take 20 mEq by mouth once daily ), Taking tranexamic acid (LYSTEDA) 650 mg tablet, Take 2 tablets (1,300 mg total) by mouth 3 (three) times daily. Take for a maximum of 5 days during monthly menstruation., Taking varenicline (CHANTIX STARTING MONTH PAK) tablet, Follow package directions.  Allergies as of 01/11/2020 - Reviewed 01/11/2020  Allergen Reaction Noted  . Fish containing products Anaphylaxis 02/03/2017  . Shellfish containing products Anaphylaxis 11/27/2015  . Sulfa (sulfonamide antibiotics) Anaphylaxis 10/22/2015  . Codeine Itching 10/22/2015  . Omnicef [cefdinir] Hives and Itching 02/03/2017  . Other Other (See Comments) 02/03/2017   Patient Active Problem List  Diagnosis  . Essential (primary) hypertension  . Acquired hypothyroidism, unspecified (TSH 2.9 - 01/04/20)  . Morbid obesity due to excess calories (CMS-HCC)  . Bandemia (WBC 12.2 - 06/01/19) - followed by Dr. Grayland Ormond  . GERD without esophagitis  . Menorrhagia with regular cycle  . Chronic generalized pain, unspecified  . Tobacco dependence (>1/2ppd; started at age 81)  . MGUS (monoclonal gammopathy of unknown significance)  . CRP elevated  . Mild intermittent asthma without complication   Past Medical History:  Diagnosis Date  . Anemia  . Anxiety  . Arrhythmia Years  . Asthma without status asthmaticus, unspecified  . Endometriosis of uterus 2013  found while having a ovarian cyst removed.  Marland Kitchen GERD (gastroesophageal reflux disease)  . Heart disease  . Heart murmur, unspecified Born with  . History of mitral valve prolapse  . Hypertension  . MGUS (monoclonal gammopathy of unknown significance)  .  Migraines  . Seizures (CMS-HCC) When young  . Thyroid disease   Past Surgical History:  Procedure Laterality Date  .  APPENDECTOMY  . CHOLECYSTECTOMY  . COLONOSCOPY 11/18/2005  Dr. Ivor Messier @ [facility] - Hyperplastic, Lavon Polyps unk behavior, rpt 10 yrs per Dr. Darrold Span, ltr mailed 09/2015  . CYSTECTOMY OVARY LAPAROSCOPIC  . Endometrial bx  . Left ovarian cyst removal surgery 12/15/2012  . neck surgery  Cervical fusion surgery   Vitals:  01/11/20 1208  BP: 118/80  Pulse: 76  SpO2: 98%  Weight: (!) 116.8 kg (257 lb 9.6 oz)  Height: 156.5 cm (5' 1.61")  PainSc: 4  PainLoc: Leg   Body mass index is 47.71 kg/m.  Exam  General. Well appearing; NAD; VS reviewed  Eyes. Sclera and conjunctiva clear; Vision grossly intact; extraocular movements intact Neck. Supple. No goiter or nodule Lungs. Respirations unlabored; clear to auscultation bilaterally; no wheezing Cardiovascular. Heart regular rate and rhythm without murmurs, gallops, or rubs Abdomen. Soft; non tender; non distended; no masses or organomegaly Extremities. no edema Skin. Normal color and turgor Neurologic. Alert and oriented x3; CN 2-12 grossly intact; no focal deficits  Assessment and Plan  1. Essential (primary) hypertension Well controlled. Cr and electrolytes wnls (12/2019). No changes to current regimen. Counseled on low salt diet and aerobic exercise 5x/wk. Advised to monitor bp 2x/wk. - Comprehensive Metabolic Panel (CMP); Future  2. Acquired hypothyroidism, unspecified (TSH 2.9 - 01/04/20) Stable. NO changes.  - Thyroid Stimulating-Hormone (TSH); Future  3. Morbid obesity due to excess calories (CMS-HCC) Not at goal. BMI 47. Counseled on nutrition modification and exercise. Goal is to lose 1lb/wk. Focus on low calorie diet (<1400/day).   4. Mild intermittent asthma without complication Stable. No changes.  5. Tobacco dependence (>1/2ppd; started at age 44) Unchanged. Smoking cessation counseling provided. Will start on Chantix starter pack. Call back after 3 weeks and start maintenance pack.  6. Routine general medical  examination at a health care facility Labs prior to PE. - Hemoglobin A1C; Future - CBC w/auto Differential (5 Part); Future - Lipid Panel w/calc LDL; Future  7. Need for hepatitis C screening test HCV screening lab ordered. - HCV Ab w/Rflx to Verification - [facility]; Future  Patient likely has mild eustachian tube dysfunction on the left side. Recommend adding Zyrtec to the Flonase. Has completed course of antibiotics and steroids.  F/u 6 months for PE; labs prior; flu vaccine recommended  [name], MD  Electronically signed by [name], MD at 01/11/2020 12:31 PM EST   Miscellaneous Notes - documented in this encounter Addendum Note - Ulla Potash, CMA - 01/11/2020 12:30 PM EST Addended by: Ulla Potash on: 01/11/2020 12:42 PM  Modules accepted: Orders    Electronically signed by Ulla Potash, CMA at 01/11/2020 12:42 PM EST

## 2020-02-18 NOTE — Patient Instructions (Addendum)
Your procedure is scheduled on: 02-25-20 MONDAY Report to Same Day Surgery 2nd floor medical mall [facility] Entrance-take elevator on left to 2nd floor.  Check in with surgery information desk.) To find out your arrival time please call [phone] between 1PM - 3PM on 02-22-20 FRIDAY  Remember: Instructions that are not followed completely may result in serious medical risk, up to and including death, or upon the discretion of your surgeon and anesthesiologist your surgery may need to be rescheduled.    _x___ 1. Do not eat food after midnight the night before your procedure. NO GUM OR CANDY AFTER MIDNIGHT. You may drink clear liquids up to 2 hours before you are scheduled to arrive at the hospital for your procedure.  Do not drink clear liquids within 2 hours of your scheduled arrival to the hospital.  Clear liquids include  --Water or Apple juice without pulp  --Gatorade  --Black Coffee or Clear Tea (No milk, no creamers, do not add anything to the coffee or Tea   ____Ensure clear carbohydrate drink on the way to the hospital for bariatric patients  ____Ensure clear carbohydrate drink 3 hours before surgery.     __x__ 2. No Alcohol for 24 hours before or after surgery.   __x__3. No Smoking or e-cigarettes for 24 prior to surgery.  Do not use any chewable tobacco products for at least 6 hour prior to surgery   ____  4. Bring all medications with you on the day of surgery if instructed.    __x__ 5. Notify your doctor if there is any change in your medical condition     (cold, fever, infections).    x___6. On the morning of surgery brush your teeth with toothpaste and water.  You may rinse your mouth with mouth wash if you wish.  Do not swallow any toothpaste or mouthwash.   Do not wear jewelry, make-up, hairpins, clips or nail polish.  Do not wear lotions, powders, or perfumes.   Do not shave 48 hours prior to surgery. Men may shave face and neck.  Do not bring valuables to the  hospital.    [facility] is not responsible for any belongings or valuables.               Contacts, dentures or bridgework may not be worn into surgery.  Leave your suitcase in the car. After surgery it may be brought to your room.  For patients admitted to the hospital, discharge time is determined by your treatment team.  _  Patients discharged the day of surgery will not be allowed to drive home.  You will need someone to drive you home and stay with you the night of your procedure.    Please read over the following fact sheets that you were given:   [facility] Preparing for Surgery   _x___ TAKE THE FOLLOWING MEDICATION THE MORNING OF SURGERY WITH A SMALL SIP OF WATER. These include:  1. BISOPROLOL (ZEBETA)  2. SYNTHROID (LEVOTHYROXINE)  3.  4.  5.  6.  ____Fleets enema or Magnesium Citrate as directed.   _x___ Use CHG Soap or sage wipes as directed on instruction sheet   _X___ Use inhalers on the day of surgery and bring to hospital day of surgery-USE YOUR ALBUTEROL INHALER DAY OF SURGERY AND [place]  ____ Stop Metformin and Janumet 2 days prior to surgery.    ____ Take 1/2 of usual insulin dose the night before surgery and none on  the morning surgery.   ____ Follow recommendations from Cardiologist, Pulmonologist or PCP regarding stopping Aspirin, Coumadin, Plavix ,Eliquis, Effient, or Pradaxa, and Pletal.  X____Stop Anti-inflammatories such as Advil, Aleve, Ibuprofen, Motrin, Naproxen, Naprosyn, Goodies powders or aspirin products NOW-OK to take Tylenol    ____ Stop supplements until after surgery.   ____ Bring C-Pap to the hospital.

## 2020-02-18 NOTE — Pre-Procedure Instructions (Signed)
ECG 12-lead5/07/2019 [facility] Component Name Value Ref Range  Vent Rate (bpm) 66   PR Interval (msec) 170   QRS Interval (msec) 94   QT Interval (msec) 382   QTc (msec) 400   Other Result Information  This result has an attachment that is not available.  Result Narrative  Normal sinus rhythm Normal ECG When compared with ECG of 12-May-2017 14:57, T wave inversion no longer evident in Inferior leads I reviewed and concur with this report. Electronically signed [id number] MD, Lucianne Muss [id number]) on 07/13/2019 9:47:08 AM  Status Results Details   Encounter Summary

## 2020-02-21 ENCOUNTER — Other Ambulatory Visit: Payer: Self-pay

## 2020-02-21 ENCOUNTER — Other Ambulatory Visit: Payer: Managed Care, Other (non HMO)

## 2020-02-21 ENCOUNTER — Encounter
Admission: RE | Admit: 2020-02-21 | Discharge: 2020-02-21 | Disposition: A | Discharge status: Home / Self Care | Payer: Managed Care, Other (non HMO) | Source: Ambulatory Visit | Attending: Obstetrics & Gynecology | Admitting: Obstetrics & Gynecology

## 2020-02-21 DIAGNOSIS — Z01812 Encounter for preprocedural laboratory examination: Secondary | ICD-10-CM | POA: Diagnosis not present

## 2020-02-21 LAB — POTASSIUM: Potassium: 3.8 mmol/L (ref 3.5–5.1)

## 2020-02-21 LAB — TYPE AND SCREEN
ABO/RH(D): A POS
Antibody Screen: NEGATIVE

## 2020-02-21 LAB — SARS CORONAVIRUS 2 (TAT 6-24 HRS): SARS Coronavirus 2: NEGATIVE

## 2020-02-25 ENCOUNTER — Encounter: Payer: Self-pay | Admitting: Obstetrics & Gynecology

## 2020-02-25 ENCOUNTER — Ambulatory Visit
Admission: RE | Admit: 2020-02-25 | Discharge: 2020-02-25 | Disposition: A | Discharge status: Home / Self Care | Payer: Managed Care, Other (non HMO) | Attending: Obstetrics & Gynecology | Admitting: Obstetrics & Gynecology

## 2020-02-25 ENCOUNTER — Ambulatory Visit: Payer: Managed Care, Other (non HMO) | Admitting: Anesthesiology

## 2020-02-25 ENCOUNTER — Encounter
Admission: RE | Disposition: A | Discharge status: Home / Self Care | Payer: Self-pay | Source: Home / Self Care | Attending: Obstetrics & Gynecology

## 2020-02-25 ENCOUNTER — Other Ambulatory Visit: Payer: Self-pay

## 2020-02-25 DIAGNOSIS — Z79899 Other long term (current) drug therapy: Secondary | ICD-10-CM | POA: Diagnosis not present

## 2020-02-25 DIAGNOSIS — N879 Dysplasia of cervix uteri, unspecified: Secondary | ICD-10-CM | POA: Diagnosis not present

## 2020-02-25 DIAGNOSIS — N946 Dysmenorrhea, unspecified: Secondary | ICD-10-CM | POA: Insufficient documentation

## 2020-02-25 DIAGNOSIS — N72 Inflammatory disease of cervix uteri: Secondary | ICD-10-CM | POA: Insufficient documentation

## 2020-02-25 DIAGNOSIS — N921 Excessive and frequent menstruation with irregular cycle: Secondary | ICD-10-CM | POA: Diagnosis present

## 2020-02-25 DIAGNOSIS — I10 Essential (primary) hypertension: Secondary | ICD-10-CM | POA: Insufficient documentation

## 2020-02-25 DIAGNOSIS — Z79890 Hormone replacement therapy (postmenopausal): Secondary | ICD-10-CM | POA: Diagnosis not present

## 2020-02-25 DIAGNOSIS — J4520 Mild intermittent asthma, uncomplicated: Secondary | ICD-10-CM | POA: Insufficient documentation

## 2020-02-25 DIAGNOSIS — E039 Hypothyroidism, unspecified: Secondary | ICD-10-CM | POA: Diagnosis not present

## 2020-02-25 DIAGNOSIS — Z6841 Body Mass Index (BMI) 40.0 and over, adult: Secondary | ICD-10-CM | POA: Diagnosis not present

## 2020-02-25 DIAGNOSIS — F17210 Nicotine dependence, cigarettes, uncomplicated: Secondary | ICD-10-CM | POA: Diagnosis not present

## 2020-02-25 DIAGNOSIS — D252 Subserosal leiomyoma of uterus: Secondary | ICD-10-CM | POA: Insufficient documentation

## 2020-02-25 DIAGNOSIS — D251 Intramural leiomyoma of uterus: Secondary | ICD-10-CM | POA: Insufficient documentation

## 2020-02-25 DIAGNOSIS — Z8673 Personal history of transient ischemic attack (TIA), and cerebral infarction without residual deficits: Secondary | ICD-10-CM | POA: Insufficient documentation

## 2020-02-25 HISTORY — PX: ROBOTIC ASSISTED TOTAL HYSTERECTOMY WITH BILATERAL SALPINGO OOPHERECTOMY: SHX6086

## 2020-02-25 LAB — CBC
HCT: 37.7 % (ref 36.0–46.0)
Hemoglobin: 13.1 g/dL (ref 12.0–15.0)
MCH: 30.6 pg (ref 26.0–34.0)
MCHC: 34.7 g/dL (ref 30.0–36.0)
MCV: 88.1 fL (ref 80.0–100.0)
Platelets: 182 10*3/uL (ref 150–400)
RBC: 4.28 MIL/uL (ref 3.87–5.11)
RDW: 13.8 % (ref 11.5–15.5)
WBC: 14 10*3/uL — ABNORMAL HIGH (ref 4.0–10.5)
nRBC: 0 % (ref 0.0–0.2)

## 2020-02-25 LAB — BASIC METABOLIC PANEL
Anion gap: 11 (ref 5–15)
BUN: 10 mg/dL (ref 6–20)
CO2: 20 mmol/L — ABNORMAL LOW (ref 22–32)
Calcium: 8.6 mg/dL — ABNORMAL LOW (ref 8.9–10.3)
Chloride: 107 mmol/L (ref 98–111)
Creatinine, Ser: 0.54 mg/dL (ref 0.44–1.00)
GFR calc Af Amer: >60 mL/min (ref >60)
GFR calc non Af Amer: >60 mL/min (ref >60)
Glucose, Bld: 97 mg/dL (ref 70–99)
Potassium: 4.1 mmol/L (ref 3.5–5.1)
Sodium: 138 mmol/L (ref 135–145)

## 2020-02-25 LAB — ABO/RH: ABO/RH(D): A POS

## 2020-02-25 LAB — POCT PREGNANCY, URINE: Preg Test, Ur: NEGATIVE

## 2020-02-25 SURGERY — HYSTERECTOMY, TOTAL, ROBOT-ASSISTED, LAPAROSCOPIC, WITH BILATERAL SALPINGO-OOPHORECTOMY
Anesthesia: General | Laterality: Bilateral

## 2020-02-25 MED ORDER — FAMOTIDINE 20 MG PO TABS
ORAL_TABLET | ORAL | Status: AC
  Administered 2020-02-25: 20 mg via ORAL
  Filled 2020-02-25: qty 1

## 2020-02-25 MED ORDER — HEPARIN SODIUM (PORCINE) 5000 UNIT/ML IJ SOLN: 5000.0000 [IU] | INTRAMUSCULAR | Status: AC

## 2020-02-25 MED ORDER — GABAPENTIN 300 MG PO CAPS
ORAL_CAPSULE | ORAL | Status: AC
  Administered 2020-02-25: 600 mg via ORAL
  Filled 2020-02-25: qty 2

## 2020-02-25 MED ORDER — PROPOFOL 10 MG/ML IV BOLUS
INTRAVENOUS | Status: AC
  Filled 2020-02-25: qty 20

## 2020-02-25 MED ORDER — KETOROLAC TROMETHAMINE 15 MG/ML IJ SOLN: 15.0000 mg | Freq: Four times a day (QID) | INTRAMUSCULAR | Status: DC

## 2020-02-25 MED ORDER — KETOROLAC TROMETHAMINE 15 MG/ML IJ SOLN: 15.0000 mg | INTRAMUSCULAR | Status: AC

## 2020-02-25 MED ORDER — SUCCINYLCHOLINE CHLORIDE 20 MG/ML IJ SOLN
INTRAMUSCULAR | Status: DC | PRN
  Administered 2020-02-25: 120 mg via INTRAVENOUS

## 2020-02-25 MED ORDER — ONDANSETRON HCL 4 MG/2ML IJ SOLN
INTRAMUSCULAR | Status: AC
  Filled 2020-02-25: qty 2

## 2020-02-25 MED ORDER — OXYCODONE HCL 5 MG PO TABS
5.0000 mg | ORAL_TABLET | Freq: Once | ORAL | Status: AC
  Administered 2020-02-25: 5 mg via ORAL

## 2020-02-25 MED ORDER — ONDANSETRON HCL 4 MG/2ML IJ SOLN: 4.0000 mg | Freq: Once | INTRAMUSCULAR | Status: DC | PRN

## 2020-02-25 MED ORDER — IBUPROFEN 800 MG PO TABS: 800.0000 mg | ORAL_TABLET | Freq: Four times a day (QID) | ORAL | 1 refills | Status: DC

## 2020-02-25 MED ORDER — ROCURONIUM BROMIDE 50 MG/5ML IV SOLN
INTRAVENOUS | Status: AC
  Filled 2020-02-25: qty 1

## 2020-02-25 MED ORDER — MIDAZOLAM HCL 2 MG/2ML IJ SOLN
INTRAMUSCULAR | Status: DC | PRN
  Administered 2020-02-25: 2 mg via INTRAVENOUS

## 2020-02-25 MED ORDER — OXYCODONE HCL 5 MG PO TABS
5.0000 mg | ORAL_TABLET | Freq: Once | ORAL | Status: AC | PRN
  Administered 2020-02-25: 5 mg via ORAL

## 2020-02-25 MED ORDER — PHENYLEPHRINE HCL (PRESSORS) 10 MG/ML IV SOLN
INTRAVENOUS | Status: DC | PRN
  Administered 2020-02-25: 100 ug via INTRAVENOUS

## 2020-02-25 MED ORDER — DEXAMETHASONE SODIUM PHOSPHATE 10 MG/ML IJ SOLN
INTRAMUSCULAR | Status: AC
  Filled 2020-02-25: qty 1

## 2020-02-25 MED ORDER — SUCCINYLCHOLINE CHLORIDE 20 MG/ML IJ SOLN
INTRAMUSCULAR | Status: AC
  Filled 2020-02-25: qty 1

## 2020-02-25 MED ORDER — MIDAZOLAM HCL 2 MG/2ML IJ SOLN
INTRAMUSCULAR | Status: AC
  Filled 2020-02-25: qty 2

## 2020-02-25 MED ORDER — FAMOTIDINE 20 MG PO TABS: 20.0000 mg | ORAL_TABLET | Freq: Once | ORAL | Status: AC

## 2020-02-25 MED ORDER — ACETAMINOPHEN 500 MG PO TABS: 1000.0000 mg | ORAL_TABLET | ORAL | Status: AC

## 2020-02-25 MED ORDER — OXYCODONE HCL 5 MG PO TABS: 5.0000 mg | ORAL_TABLET | ORAL | 0 refills | Status: DC | PRN

## 2020-02-25 MED ORDER — FENTANYL CITRATE (PF) 100 MCG/2ML IJ SOLN
INTRAMUSCULAR | Status: DC | PRN
  Administered 2020-02-25: 100 ug via INTRAVENOUS

## 2020-02-25 MED ORDER — DEXAMETHASONE SODIUM PHOSPHATE 10 MG/ML IJ SOLN
INTRAMUSCULAR | Status: AC
  Administered 2020-02-25: 4 mg via INTRAVENOUS
  Filled 2020-02-25: qty 1

## 2020-02-25 MED ORDER — CEFAZOLIN SODIUM-DEXTROSE 2-4 GM/100ML-% IV SOLN
INTRAVENOUS | Status: AC
  Filled 2020-02-25: qty 100

## 2020-02-25 MED ORDER — GABAPENTIN 300 MG PO CAPS: 600.0000 mg | ORAL_CAPSULE | ORAL | Status: AC

## 2020-02-25 MED ORDER — SODIUM CHLORIDE FLUSH 0.9 % IV SOLN
INTRAVENOUS | Status: AC
  Filled 2020-02-25: qty 10

## 2020-02-25 MED ORDER — LIDOCAINE HCL (PF) 2 % IJ SOLN
INTRAMUSCULAR | Status: AC
  Filled 2020-02-25: qty 5

## 2020-02-25 MED ORDER — FENTANYL CITRATE (PF) 100 MCG/2ML IJ SOLN
25.0000 ug | INTRAMUSCULAR | Status: DC | PRN
  Administered 2020-02-25 (×3): 25 ug via INTRAVENOUS

## 2020-02-25 MED ORDER — ACETAMINOPHEN 500 MG PO TABS
ORAL_TABLET | ORAL | Status: AC
  Administered 2020-02-25: 1000 mg via ORAL
  Filled 2020-02-25: qty 2

## 2020-02-25 MED ORDER — SUGAMMADEX SODIUM 200 MG/2ML IV SOLN
INTRAVENOUS | Status: AC
  Filled 2020-02-25: qty 4

## 2020-02-25 MED ORDER — BUPIVACAINE HCL (PF) 0.5 % IJ SOLN
INTRAMUSCULAR | Status: AC
  Filled 2020-02-25: qty 30

## 2020-02-25 MED ORDER — SCOPOLAMINE 1 MG/3DAYS TD PT72
MEDICATED_PATCH | TRANSDERMAL | Status: AC
  Administered 2020-02-25: 1.5 mg via TRANSDERMAL
  Filled 2020-02-25: qty 1

## 2020-02-25 MED ORDER — BUPIVACAINE HCL (PF) 0.5 % IJ SOLN
INTRAMUSCULAR | Status: DC | PRN
  Administered 2020-02-25: 20 mL

## 2020-02-25 MED ORDER — FENTANYL CITRATE (PF) 250 MCG/5ML IJ SOLN
INTRAMUSCULAR | Status: AC
  Filled 2020-02-25: qty 5

## 2020-02-25 MED ORDER — DEXAMETHASONE SODIUM PHOSPHATE 10 MG/ML IJ SOLN: 4.0000 mg | INTRAMUSCULAR | Status: AC

## 2020-02-25 MED ORDER — CEFAZOLIN SODIUM-DEXTROSE 2-4 GM/100ML-% IV SOLN
2.0000 g | INTRAVENOUS | Status: AC
  Administered 2020-02-25: 2 g via INTRAVENOUS

## 2020-02-25 MED ORDER — DEXAMETHASONE SODIUM PHOSPHATE 10 MG/ML IJ SOLN
INTRAMUSCULAR | Status: DC | PRN
  Administered 2020-02-25: 10 mg via INTRAVENOUS

## 2020-02-25 MED ORDER — SCOPOLAMINE 1 MG/3DAYS TD PT72: 1.0000 | MEDICATED_PATCH | TRANSDERMAL | Status: DC

## 2020-02-25 MED ORDER — KETOROLAC TROMETHAMINE 15 MG/ML IJ SOLN
INTRAMUSCULAR | Status: AC
  Administered 2020-02-25: 15 mg via INTRAVENOUS
  Filled 2020-02-25: qty 1

## 2020-02-25 MED ORDER — HEPARIN SODIUM (PORCINE) 5000 UNIT/ML IJ SOLN
INTRAMUSCULAR | Status: AC
  Administered 2020-02-25: 5000 [IU] via SUBCUTANEOUS
  Filled 2020-02-25: qty 1

## 2020-02-25 MED ORDER — ROCURONIUM BROMIDE 100 MG/10ML IV SOLN
INTRAVENOUS | Status: DC | PRN
  Administered 2020-02-25 (×2): 10 mg via INTRAVENOUS
  Administered 2020-02-25: 30 mg via INTRAVENOUS
  Administered 2020-02-25: 20 mg via INTRAVENOUS

## 2020-02-25 MED ORDER — PROPOFOL 10 MG/ML IV BOLUS
INTRAVENOUS | Status: DC | PRN
  Administered 2020-02-25: 150 mg via INTRAVENOUS

## 2020-02-25 MED ORDER — LACTATED RINGERS IV SOLN: INTRAVENOUS | Status: DC

## 2020-02-25 MED ORDER — SILVER NITRATE-POT NITRATE 75-25 % EX MISC
CUTANEOUS | Status: AC
  Filled 2020-02-25: qty 10

## 2020-02-25 MED ORDER — OXYCODONE HCL 5 MG PO TABS
ORAL_TABLET | ORAL | Status: AC
  Filled 2020-02-25: qty 1

## 2020-02-25 MED ORDER — FENTANYL CITRATE (PF) 100 MCG/2ML IJ SOLN
INTRAMUSCULAR | Status: AC
  Administered 2020-02-25: 25 ug via INTRAVENOUS
  Filled 2020-02-25: qty 2

## 2020-02-25 MED ORDER — LIDOCAINE HCL (CARDIAC) PF 100 MG/5ML IV SOSY
PREFILLED_SYRINGE | INTRAVENOUS | Status: DC | PRN
  Administered 2020-02-25: 100 mg via INTRAVENOUS

## 2020-02-25 SURGICAL SUPPLY — 65 items
ADH SKN CLS APL DERMABOND .7 (GAUZE/BANDAGES/DRESSINGS) ×1
APL PRP STRL LF DISP 70% ISPRP (MISCELLANEOUS) ×1
BAG DRN RND TRDRP ANRFLXCHMBR (UROLOGICAL SUPPLIES) ×1
BAG URINE DRAIN 2000ML AR STRL (UROLOGICAL SUPPLIES) ×3 IMPLANT
BLADE SURG SZ11 CARB STEEL (BLADE) ×6 IMPLANT
CANISTER SUCT 1200ML W/VALVE (MISCELLANEOUS) ×3 IMPLANT
CATH FOLEY 2WAY  5CC 16FR (CATHETERS) ×3
CATH FOLEY 2WAY 5CC 16FR (CATHETERS) ×1
CATH URTH 16FR FL 2W BLN LF (CATHETERS) ×1 IMPLANT
CHLORAPREP W/TINT 26 (MISCELLANEOUS) ×3 IMPLANT
COVER TIP SHEARS 8 DVNC (MISCELLANEOUS) ×1 IMPLANT
COVER TIP SHEARS 8MM DA VINCI (MISCELLANEOUS) ×3
COVER WAND RF STERILE (DRAPES) ×3 IMPLANT
DEFOGGER SCOPE WARMER CLEARIFY (MISCELLANEOUS) ×3 IMPLANT
DERMABOND ADVANCED (GAUZE/BANDAGES/DRESSINGS) ×2
DERMABOND ADVANCED .7 DNX12 (GAUZE/BANDAGES/DRESSINGS) ×1 IMPLANT
DRAPE 3/4 80X56 (DRAPES) ×9 IMPLANT
DRAPE ARM DVNC X/XI (DISPOSABLE) ×4 IMPLANT
DRAPE COLUMN DVNC XI (DISPOSABLE) ×1 IMPLANT
DRAPE DA VINCI XI ARM (DISPOSABLE) ×12
DRAPE DA VINCI XI COLUMN (DISPOSABLE) ×3
DRAPE LEGGINS SURG 28X43 STRL (DRAPES) ×3 IMPLANT
DRAPE UNDER BUTTOCK W/FLU (DRAPES) ×3 IMPLANT
ELECT REM PT RETURN 9FT ADLT (ELECTROSURGICAL) ×3
ELECTRODE REM PT RTRN 9FT ADLT (ELECTROSURGICAL) ×1 IMPLANT
GLOVE SURG SYN 6.5 ES PF (GLOVE) ×12 IMPLANT
GLOVE SURG SYN 6.5 PF PI (GLOVE) ×4 IMPLANT
GOWN STRL REUS W/ TWL LRG LVL3 (GOWN DISPOSABLE) ×4 IMPLANT
GOWN STRL REUS W/TWL LRG LVL3 (GOWN DISPOSABLE) ×12
IRRIGATION STRYKERFLOW (MISCELLANEOUS) IMPLANT
IRRIGATOR STRYKERFLOW (MISCELLANEOUS)
IV NS 1000ML (IV SOLUTION)
IV NS 1000ML BAXH (IV SOLUTION) IMPLANT
KIT PINK PAD W/HEAD ARE REST (MISCELLANEOUS) ×3
KIT PINK PAD W/HEAD ARM REST (MISCELLANEOUS) ×1 IMPLANT
L-HOOK LAP DISP 36CM (ELECTROSURGICAL) ×3
LABEL OR SOLS (LABEL) ×3 IMPLANT
LHOOK LAP DISP 36CM (ELECTROSURGICAL) IMPLANT
MANIPULATOR VCARE LG CRV RETR (MISCELLANEOUS) IMPLANT
MANIPULATOR VCARE SML CRV RETR (MISCELLANEOUS) IMPLANT
MANIPULATOR VCARE STD CRV RETR (MISCELLANEOUS) ×2 IMPLANT
NEEDLE HYPO 22GX1.5 SAFETY (NEEDLE) ×3 IMPLANT
NEEDLE VERESS 14GA 120MM (NEEDLE) ×3 IMPLANT
NS IRRIG 1000ML POUR BTL (IV SOLUTION) ×3 IMPLANT
OBTURATOR OPTICAL STANDARD 8MM (TROCAR) ×3
OBTURATOR OPTICAL STND 8 DVNC (TROCAR) ×1
OBTURATOR OPTICALSTD 8 DVNC (TROCAR) ×1 IMPLANT
PACK LAP CHOLECYSTECTOMY (MISCELLANEOUS) ×3 IMPLANT
PAD OB MATERNITY 4.3X12.25 (PERSONAL CARE ITEMS) ×3 IMPLANT
PAD PREP 24X41 OB/GYN DISP (PERSONAL CARE ITEMS) ×3 IMPLANT
PENCIL ELECTRO HAND CTR (MISCELLANEOUS) ×3 IMPLANT
SEAL CANN UNIV 5-8 DVNC XI (MISCELLANEOUS) ×4 IMPLANT
SEAL XI 5MM-8MM UNIVERSAL (MISCELLANEOUS) ×12
SEALER VESSEL DA VINCI XI (MISCELLANEOUS)
SEALER VESSEL EXT DVNC XI (MISCELLANEOUS) IMPLANT
SET CYSTO W/LG BORE CLAMP LF (SET/KITS/TRAYS/PACK) IMPLANT
SET TUBE SMOKE EVAC HIGH FLOW (TUBING) ×3 IMPLANT
SOLUTION ELECTROLUBE (MISCELLANEOUS) ×3 IMPLANT
SUT DVC VLOC 180 0 12IN GS21 (SUTURE)
SUT MNCRL 4-0 (SUTURE) ×3
SUT MNCRL 4-0 27XMFL (SUTURE) ×1
SUT VIC AB 0 CT1 36 (SUTURE) ×3 IMPLANT
SUTURE DVC VLC 180 0 12IN GS21 (SUTURE) IMPLANT
SUTURE MNCRL 4-0 27XMF (SUTURE) ×1 IMPLANT
SYR 10ML LL (SYRINGE) ×3 IMPLANT

## 2020-02-25 NOTE — H&P (Signed)
Preoperative History and Physical  Allison Martinez is a 44 y.o.  here for surgical management of menorrhagia, dysmenorrha.   No significant preoperative concerns.  Proposed surgery:  robotic assisted total laparoscopic hysterectomy, bilateral salpingectomy  Past Medical History:  Diagnosis Date  . Achilles tendonitis   . Anemia   . Anxiety   . Arthritis    back and neck  . Asthma   . Bandemia   . Complication of anesthesia    hard time waking up after neck surgery and 2 breathing txs during surgery  . Diverticulitis   . Dysrhythmia   . Fibromyalgia 06/07/2017  . GERD (gastroesophageal reflux disease)   . H/O hiatal hernia    small  . Headache(784.0)   . Hypertension   . Hypothyroidism   . IBS (irritable bowel syndrome)   . MGUS (monoclonal gammopathy of unknown significance)    SEEN AT [facility]ER CENTER EVERY 6 MONTHS  . Migraine    complex  . Mitral valve prolapse   . Murmur, cardiac   . Pneumonia   . Seizure ([place])    ONLY AS A BABY  . 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 ANTERIOR CERVICAL DECOMP/DISCECTOMY FUSION    . APPENDECTOMY    . CHOLECYSTECTOMY    . LAPAROSCOPY  12/15/2012   Procedure: LAPAROSCOPY DIAGNOSTIC;  Surgeon: Lovenia Kim, MD;  Location: [facility];  Service: Gynecology;  Laterality: N/A;  . MOUTH SURGERY     OB History  No obstetric history on file.  Patient denies any other pertinent gynecologic issues.   No current facility-administered medications on file prior to encounter.   Current Outpatient Medications on File Prior to Encounter  Medication Sig Dispense Refill  . albuterol (VENTOLIN HFA) 108 (90 Base) MCG/ACT inhaler Inhale 1 puff into the lungs every morning. And PRN    . bisoprolol (ZEBETA) 5 MG tablet Take 2.5 mg by mouth every morning.     . hydrochlorothiazide (HYDRODIURIL) 25 MG tablet Take 25 mg by mouth daily.    Marland Kitchen  levothyroxine (SYNTHROID, LEVOTHROID) 75 MCG tablet Take 75 mcg by mouth daily before breakfast.     . potassium chloride SA (K-DUR,KLOR-CON) 20 MEQ tablet Take 1 tablet (20 mEq total) by mouth 2 (two) times daily. (Patient taking differently: Take 20 mEq by mouth daily. ) 60 tablet 1  . vitamin B-12 (CYANOCOBALAMIN) 500 MCG tablet Take 500 mcg by mouth daily.    Marland Kitchen VITAMIN D, CHOLECALCIFEROL, PO Take 1 capsule by mouth daily.     Allergies  Allergen Reactions  . Fish-Derived Products Anaphylaxis  . Shellfish Allergy Anaphylaxis  . Sulfa Antibiotics Anaphylaxis  . Tramadol Hcl Shortness Of Breath and Nausea Only    Passed out  . Codeine Itching  . Tape     PAPER TAPE OK TO USE  . Tylenol [Acetaminophen]     Cant take tylenol #3  . Cefdinir Rash    Can take amoxicillin without problems    Social History:   reports that she has been smoking cigarettes. She has a 3.00 pack-year smoking history. She has never used smokeless tobacco. She reports current alcohol use. She reports that she does not use drugs.  Family History  Problem Relation Age of Onset  . Arrhythmia Mother   . Hypertension Mother   . Heart disease Father     Review of Systems:  Noncontributory  PHYSICAL EXAM: Blood pressure (!) 148/72, pulse 62, temperature 98.3 F (36.8 C), temperature source Oral, resp. rate 18, height 5\' 1"  (1.549 m), weight 113.4 kg, last menstrual period 02/17/2020, SpO2 99 %. General appearance - alert, well appearing, and in no distress Chest - clear to auscultation, no wheezes, rales or rhonchi, symmetric air entry Heart - normal rate and regular rhythm Abdomen - soft, nontender, nondistended, no masses or organomegaly Pelvic - examination not indicated Extremities - peripheral pulses normal, no pedal edema, no clubbing or cyanosis  Labs: Results for orders placed or performed during the hospital encounter of 02/25/20 (from the past 336 hour(s))  Pregnancy, urine POC   Collection Time:  02/25/20  9:29 AM  Result Value Ref Range   Preg Test, Ur NEGATIVE NEGATIVE  ABO/Rh   Collection Time: 02/25/20  9:56 AM  Result Value Ref Range   ABO/RH(D) PENDING   Results for orders placed or performed during the hospital encounter of 02/21/20 (from the past 336 hour(s))  Potassium   Collection Time: 02/21/20  8:45 AM  Result Value Ref Range   Potassium 3.8 3.5 - 5.1 mmol/L  Type and screen [place]   Collection Time: 02/21/20  8:45 AM  Result Value Ref Range   ABO/RH(D) A POS    Antibody Screen NEG    Sample Expiration 03/06/2020,2359    Extend sample reason      NO TRANSFUSIONS OR PREGNANCY IN THE PAST 3 MONTHS Performed at [facility], [address] [place], [place] [address]   SARS CORONAVIRUS 2 (TAT 6-24 HRS) Nasopharyngeal Nasopharyngeal Swab   Collection Time: 02/21/20  9:33 AM   Specimen: Nasopharyngeal Swab  Result Value Ref Range   SARS Coronavirus 2 NEGATIVE NEGATIVE    Imaging Studies: No results found.  Assessment: Patient Active Problem List   Diagnosis Date Noted  . Mild intermittent asthma without complication [id number]  . Arrhythmia 07/11/2017  . CRP elevated 07/04/2017  . Elevated erythrocyte sedimentation rate 07/04/2017  . Cervical radiculitis 06/07/2017  . Menorrhagia with regular cycle 06/07/2017  . Chronic pain syndrome 06/07/2017  . Long term prescription opiate use 06/07/2017  . Opiate use 06/07/2017  . Chronic shoulder pain (Location of Primary Source of Pain) (Bilateral) (L>R) 06/07/2017  . Chronic lower extremity pain (Location of Secondary source of pain) (Bilateral) (L>R) 06/07/2017  . Chronic upper extremity pain (Location of Tertiary source of pain) (Bilateral) (L>R) 06/07/2017  . Diffuse myofascial pain syndrome 06/07/2017  . Fibromyalgia 06/07/2017  . Chronic knee pain (Bilateral) (L>R) 06/07/2017  . Chronic wrist pain (Bilateral) (R>L) 06/07/2017  . Borderline diabetes mellitus 05/06/2017   . Chronic generalized pain 05/04/2017  . Tobacco dependence 05/04/2017  . Bandemia 02/03/2017  . GERD without esophagitis 02/03/2017  . MGUS (monoclonal gammopathy of unknown significance) 09/07/2016  . Leukocytosis 09/07/2016  . Morbid obesity with BMI of 50.0-59.9, adult ([place]) 02/02/2016  . Morbid obesity due to excess calories ([place]) 02/02/2016  . Diverticulitis 01/31/2012  . Constipation 01/31/2012  . Acquired hypothyroidism 01/31/2012  . Nausea 01/31/2012  . HTN (hypertension) 01/31/2012    Plan: Patient will undergo surgical management with robotic assisted total laparoscopic hysterectomy, bilateral salpingectomy.   The risks of surgery were discussed in detail with the patient including but not limited to: bleeding which may require transfusion or reoperation; infection which may require antibiotics; injury to surrounding organs which may involve bowel, bladder, ureters ; need for additional procedures including laparoscopy or laparotomy; thromboembolic phenomenon,  surgical site problems and other postoperative/anesthesia complications. Likelihood of success in alleviating the patient's condition was discussed. Routine postoperative instructions will be reviewed with the patient and her family in detail after surgery.  The patient concurred with the proposed plan, giving informed written consent for the surgery.  Patient has been NPO since last night she will remain NPO for procedure.  Anesthesia and OR aware.  Preoperative prophylactic antibiotics and SCDs ordered on call to the OR.  To OR when ready.  ----- Larey Days, MD, [place] Attending Obstetrician and Gynecologist [facility], Department of [facility]  02/25/2020 11:04 AM

## 2020-02-25 NOTE — Op Note (Signed)
02/25/2020  PATIENT:  Allison Martinez  44 y.o. female  PRE-OPERATIVE DIAGNOSIS:  menometrohagia  POST-OPERATIVE DIAGNOSIS:  menometrohagia  PROCEDURE:  Procedure(s): XI ROBOTIC ASSISTED TOTAL HYSTERECTOMY WITH BILATERAL SALPINGECTOMY (Bilateral)  SURGEON:  Surgeon(s) and Role:    * Romonia Yanik, Honor Loh, MD - Primary Adrienne Allred, RNFA - assist  ANESTHESIA: GET  EBL:  Total I/O In: 1000 [I.V.:900; IV Piggyback:100] Out: 230 [Urine:200; Blood:30]  DRAINS: foley to gravity, removed postop  SPECIMEN: Uterus, Cervix, bilateral tubes   DISPOSITION OF SPECIMEN:  To pathology  FINDINGS: Tight banded vagina at apex small band of omentum adherent to anterior abdominal wall at umbilicus.   Fundal subserosal fibroid Normal upper abdomen Moderate stool burden in ascending and transverse colon  COUNTS: correct x2  COMPLICATIONS: none apparent  PATIENT DISPOSITION:  VS stable to PACU    Indication for surgery: Patient had presented with complaints of menometrorrhagia, and dysmenorrhea.  Various treatment options were discussed and patient requested a hysterectomy to resolve her symptoms.  Risks benefits and alternatives were reviewed and informed consent was obtained.   Procedure: The patient was brought to the OR and identified as Allison Martinez.  She was given general anesthesia via endotracheal route.  Nasogastric tube was placed.  She was then positioned in the dorsal lithotomy position and prepped and draped in the usual sterile fashion.  A surgical time-out was called. A foley catheter was placed.  A speculum was placed in the vagina and the cervix was visualized, grasped with a single tooth tenaculum and the V-Care uterine manipulator was placed in and around the cervix.  After a change of gloves, the attention was turned to the abdomen.  An umbilical incision was made.  The subcutaneous tissues were dissected, the fascia was divided, the peritoneum entered, and a robotic trochar was  inserted.  Pneumoperitoneum was created to 32mmHg.  Three additional robotic trochars were inserted atraumatically under visualization.  The patient was placed in steep trendelenburg, and the [employer] robot was docked and monopolar scissors, bipolar fenestrated grasper, and maryland were employed.  A brief survey of the upper abdomen was performed.  The attention was turned to the pelvis.  The bilateral fallopian tubes were dissected off of the ovary, and the mesosalpinx divided to the cornua.  The utero-ovarian ligaments were cauterized and transected.  The bilateral round ligaments were cauterized and transected.  The anterior peritoneum was divided and a bladder flap was created.  The underlying peritoneum was divided and the bilateral uterine arteries were skeletonized, sealed, and divided.  The cardinal and uterosacral ligaments were divided and a colpotomy was created around the cervical cup.  The uterus, cervix, tubes and ovaries were removed through the vagina and handed off to nursing to be sent to pathology.  The instruments were changed to needle drivers, and the vaginal cuff was closed using V-Lock barbed suture in a running stitch.  The cuff was tested for integrity.    The laparoscopic instruments were removed, the remaining trochars were removed and the skin of all incision were closed with 4-0 monocryl and covered with surgical glue.  The vagina was inspected for lacerations, and the foley catheter was removed. The procedure was then deemed complete.    The sponge, needle, and instrument counts were correct x2.  The patient tolerated reversal of anesthesia, and was brought to the PACU in a stable condition.  I was present and performed this case in its entirety. Larey Days, MD Attending Obstetrician and Gynecologist  [facility]

## 2020-02-25 NOTE — Anesthesia Preprocedure Evaluation (Signed)
Anesthesia Evaluation  Patient identified by MRN, date of birth, ID band Patient awake    Reviewed: Allergy & Precautions, NPO status , Patient's Chart, lab work & pertinent test results  History of Anesthesia Complications (+) PROLONGED EMERGENCE and history of anesthetic complications (breathing problems during surgery)  Airway Mallampati: III       Dental   Pulmonary asthma , neg sleep apnea, Current Smoker and Patient abstained from smoking.,           Cardiovascular hypertension, Pt. on medications + dysrhythmias (palpitations) + Valvular Problems/Murmurs (murmur, no tx)      Neuro/Psych Seizures - (none since 44 yo),  Anxiety CVA (migraine with CVA symptoms)    GI/Hepatic Neg liver ROS, hiatal hernia, GERD  Medicated and Controlled,  Endo/Other  neg diabetesHypothyroidism Morbid obesity  Renal/GU negative Renal ROS     Musculoskeletal   Abdominal   Peds  Hematology  (+) anemia ,   Anesthesia Other Findings   Reproductive/Obstetrics                             Anesthesia Physical Anesthesia Plan  ASA: III  Anesthesia Plan: General   Post-op Pain Management:    Induction:   PONV Risk Score and Plan: 2 and Ondansetron and Dexamethasone  Airway Management Planned: Oral ETT  Additional Equipment:   Intra-op Plan:   Post-operative Plan:   Informed Consent: I have reviewed the patients History and Physical, chart, labs and discussed the procedure including the risks, benefits and alternatives for the proposed anesthesia with the patient or authorized representative who has indicated his/her understanding and acceptance.       Plan Discussed with:   Anesthesia Plan Comments:         Anesthesia Quick Evaluation

## 2020-02-25 NOTE — Anesthesia Procedure Notes (Signed)
Procedure Name: Intubation Performed by: Fredderick Phenix, CRNA Pre-anesthesia Checklist: Patient identified, Emergency Drugs available, Suction available and Patient being monitored Patient Re-evaluated:Patient Re-evaluated prior to induction Oxygen Delivery Method: Circle system utilized Preoxygenation: Pre-oxygenation with 100% oxygen Induction Type: IV induction Ventilation: Mask ventilation without difficulty Laryngoscope Size: Mac and 4 Grade View: Grade I Tube type: Oral Tube size: 7.0 mm Number of attempts: 1 Airway Equipment and Method: Stylet Placement Confirmation: ETT inserted through vocal cords under direct vision,  positive ETCO2 and breath sounds checked- equal and bilateral Secured at: 21 cm Tube secured with: Tape Dental Injury: Teeth and Oropharynx as per pre-operative assessment

## 2020-02-25 NOTE — Anesthesia Postprocedure Evaluation (Signed)
Anesthesia Post Note  Patient: Allison Martinez  Procedure(s) Performed: XI ROBOTIC ASSISTED TOTAL HYSTERECTOMY WITH BILATERAL SALPINGECTOMY (Bilateral )  Patient location during evaluation: PACU Anesthesia Type: General Level of consciousness: awake and alert Pain management: pain level controlled Vital Signs Assessment: post-procedure vital signs reviewed and stable Respiratory status: spontaneous breathing and respiratory function stable Cardiovascular status: stable Anesthetic complications: no     Last Vitals:  Vitals:   02/25/20 1415 02/25/20 1438  BP: 123/62   Pulse: (!) 57 (!) 57  Resp: 13 13  Temp: (!) 36.4 C   SpO2: 100% 96%    Last Pain:  Vitals:   02/25/20 1443  TempSrc:   PainSc: 5                  Misheel Gowans K

## 2020-02-25 NOTE — Transfer of Care (Signed)
Immediate Anesthesia Transfer of Care Note  Patient: Allison Martinez  Procedure(s) Performed: XI ROBOTIC ASSISTED TOTAL HYSTERECTOMY WITH BILATERAL SALPINGECTOMY (Bilateral )  Patient Location: PACU  Anesthesia Type:General  Level of Consciousness: awake, alert  and oriented  Airway & Oxygen Therapy: Patient Spontanous Breathing and Patient connected to face mask oxygen  Post-op Assessment: Report given to RN and Post -op Vital signs reviewed and stable  Post vital signs: Reviewed and stable  Last Vitals:  Vitals Value Taken Time  BP 123/62 02/25/20 1412  Temp    Pulse 58 02/25/20 1417  Resp 22 02/25/20 1417  SpO2 97 % 02/25/20 1417  Vitals shown include unvalidated device data.  Last Pain:  Vitals:   02/25/20 0906  TempSrc: Oral  PainSc: 0-No pain         Complications: No apparent anesthesia complications

## 2020-02-25 NOTE — Discharge Instructions (Addendum)
Oxycodone 5mg  taken March 8 at 3:15pm   Discharge instructions:  Call office if you have any of the following: fever >101 F, chills, shortness of breath, excessive vaginal bleeding (you will have some), incision drainage or problems, leg pain or redness, or any other concerns.   Activity: Do not lift > 20 lbs for 8 weeks.  No intercourse or tampons for 8 weeks.  No driving until you are certain you can slam on the brakes, and of course never on narcotics.  You may feel some pain in your upper right abdomen/rib and right shoulder.  This is from the gas in the abdomen for surgery. This will subside over time, please be patient!  Take 800mg  Ibuprofen and 1000mg  Tylenol together, around the clock, every 6 hours for at least the first 3-5 days.  After this you can take as needed.  This will help decrease inflammation and promote healing.  The narcotics you'll take just as needed, as they just trick your brain into thinking its not in pain.    Please don't limit yourself in terms of routine activity.  You will be able to do most things, although they may take longer to do or be a little painful.  You can do it!  Don't be a hero, but don't be a wimp either!       Total Laparoscopic Hysterectomy, Care After This sheet gives you information about how to care for yourself after your procedure. Your health care provider may also give you more specific instructions. If you have problems or questions, contact your health care provider. What can I expect after the procedure? After the procedure, it is common to have:  Pain and bruising around your incisions.  A sore throat, if a breathing tube was used during surgery.  Fatigue.  Poor appetite.  Less interest in sex. If your ovaries were also removed, it is also common to have symptoms of menopause such as hot flashes, night sweats, and lack of sleep (insomnia). Follow these instructions at home: Bathing  Do not take baths, swim, or use a  hot tub until your health care provider approves. You may need to only take showers for 2-3 weeks.  Keep your bandage (dressing) dry until your health care provider says it can be removed. Incision care   Follow instructions from your health care provider about how to take care of your incisions. Make sure you: ? Wash your hands with soap and water before you change your dressing. If soap and water are not available, use hand sanitizer. ? Change your dressing as told by your health care provider. ? Leave stitches (sutures), skin glue, or adhesive strips in place. These skin closures may need to stay in place for 2 weeks or longer. If adhesive strip edges start to loosen and curl up, you may trim the loose edges. Do not remove adhesive strips completely unless your health care provider tells you to do that.  Check your incision area every day for signs of infection. Check for: ? Redness, swelling, or pain. ? Fluid or blood. ? Warmth. ? Pus or a bad smell. Activity  Get plenty of rest and sleep.  Do not lift anything that is heavier than 10 lbs (4.5 kg) for one month after surgery, or as long as told by your health care provider.  Do not drive or use heavy machinery while taking prescription pain medicine.  Do not drive for 24 hours if you were given a medicine to help  you relax (sedative).  Return to your normal activities as told by your health care provider. Ask your health care provider what activities are safe for you. Lifestyle   Do not use any products that contain nicotine or tobacco, such as cigarettes and e-cigarettes. These can delay healing. If you need help quitting, ask your health care provider.  Do not drink alcohol until your health care provider approves. General instructions  Do not douche, use tampons, or have sex for at least 6 weeks, or as told by your health care provider.  Take over-the-counter and prescription medicines only as told by your health care  provider.  To monitor yourself for a fever, take your temperature at least once a day during recovery.  If you struggle with physical or emotional changes after your procedure, speak with your health care provider or a therapist.  To prevent or treat constipation while you are taking prescription pain medicine, your health care provider may recommend that you: ? Drink enough fluid to keep your urine clear or pale yellow. ? Take over-the-counter or prescription medicines. ? Eat foods that are high in fiber, such as fresh fruits and vegetables, whole grains, and beans. ? Limit foods that are high in fat and processed sugars, such as fried and sweet foods.  Keep all follow-up visits as told by your health care provider. This is important. Contact a health care provider if:  You have chills or a fever.  You have redness, swelling, or pain around an incision.  You have fluid or blood coming from an incision.  Your incision feels warm to the touch.  You have pus or a bad smell coming from an incision.  An incision breaks open.  You feel dizzy or light-headed.  You have pain or bleeding when you urinate.  You have diarrhea, nausea, or vomiting that does not go away.  You have abnormal vaginal discharge.  You have a rash.  You have pain that does not get better with medicine. Get help right away if:  You have a fever and your symptoms suddenly get worse.  You have severe abdominal pain.  You have chest pain.  You have shortness of breath.  You faint.  You have pain, swelling, or redness on your leg.  You have heavy vaginal bleeding with blood clots. Summary  After the procedure it is common to have abdominal pain. Your provider will give you medication for this.  Do not take baths, swim, or use a hot tub until your health care provider approves.  Do not lift anything that is heavier than 10 lbs (4.5 kg) for one month after surgery, or as long as told by your health  care provider.  Notify your provider if you have any signs or symptoms of infection after the procedure. This information is not intended to replace advice given to you by your health care provider. Make sure you discuss any questions you have with your health care provider. Document Revised: 11/18/2017 Document Reviewed: 02/16/2017 Elsevier Patient Education  2020 [place]   1) The drugs that you were given will stay in your system until tomorrow so for the next 24 hours you should not:  A) Drive an automobile B) Make any legal decisions C) Drink any alcoholic beverage   2) You may resume regular meals tomorrow.  Today it is better to start with liquids and gradually work up to solid foods.  You may eat  anything you prefer, but it is better to start with liquids, then soup and crackers, and gradually work up to solid foods.   3) Please notify your doctor immediately if you have any unusual bleeding, trouble breathing, redness and pain at the surgery site, drainage, fever, or pain not relieved by medication.    4) Additional Instructions:        Please contact your physician with any problems or Same Day Surgery at [phone], Monday through Friday 6 am to 4 pm, or [place] at [facility] number at [phone].

## 2020-02-26 LAB — SURGICAL PATHOLOGY

## 2020-05-26 ENCOUNTER — Other Ambulatory Visit: Payer: Managed Care, Other (non HMO)

## 2020-05-27 ENCOUNTER — Inpatient Hospital Stay: Payer: Managed Care, Other (non HMO)

## 2020-06-02 ENCOUNTER — Ambulatory Visit: Payer: Managed Care, Other (non HMO) | Admitting: Oncology

## 2020-06-06 ENCOUNTER — Other Ambulatory Visit: Payer: Self-pay | Admitting: Emergency Medicine

## 2020-06-06 DIAGNOSIS — D472 Monoclonal gammopathy: Secondary | ICD-10-CM

## 2020-06-09 ENCOUNTER — Inpatient Hospital Stay: Payer: Managed Care, Other (non HMO) | Attending: Oncology

## 2020-06-09 ENCOUNTER — Other Ambulatory Visit: Payer: Self-pay

## 2020-06-09 DIAGNOSIS — I10 Essential (primary) hypertension: Secondary | ICD-10-CM | POA: Diagnosis not present

## 2020-06-09 DIAGNOSIS — D72829 Elevated white blood cell count, unspecified: Secondary | ICD-10-CM | POA: Diagnosis not present

## 2020-06-09 DIAGNOSIS — E876 Hypokalemia: Secondary | ICD-10-CM | POA: Insufficient documentation

## 2020-06-09 DIAGNOSIS — E039 Hypothyroidism, unspecified: Secondary | ICD-10-CM | POA: Diagnosis not present

## 2020-06-09 DIAGNOSIS — D472 Monoclonal gammopathy: Secondary | ICD-10-CM | POA: Insufficient documentation

## 2020-06-09 LAB — BASIC METABOLIC PANEL
Anion gap: 10 (ref 5–15)
BUN: 8 mg/dL (ref 6–20)
CO2: 25 mmol/L (ref 22–32)
Calcium: 8.4 mg/dL — ABNORMAL LOW (ref 8.9–10.3)
Chloride: 102 mmol/L (ref 98–111)
Creatinine, Ser: 1.11 mg/dL — ABNORMAL HIGH (ref 0.44–1.00)
GFR calc Af Amer: >60 mL/min (ref >60)
GFR calc non Af Amer: >60 mL/min (ref >60)
Glucose, Bld: 113 mg/dL — ABNORMAL HIGH (ref 70–99)
Potassium: 3.2 mmol/L — ABNORMAL LOW (ref 3.5–5.1)
Sodium: 137 mmol/L (ref 135–145)

## 2020-06-09 LAB — CBC WITH DIFFERENTIAL/PLATELET
Abs Immature Granulocytes: 0.06 10*3/uL (ref 0.00–0.07)
Basophils Absolute: 0 10*3/uL (ref 0.0–0.1)
Basophils Relative: 0 %
Eosinophils Absolute: 0.4 10*3/uL (ref 0.0–0.5)
Eosinophils Relative: 3 %
HCT: 35.6 % — ABNORMAL LOW (ref 36.0–46.0)
Hemoglobin: 12.9 g/dL (ref 12.0–15.0)
Immature Granulocytes: 0 %
Lymphocytes Relative: 33 %
Lymphs Abs: 4.6 10*3/uL — ABNORMAL HIGH (ref 0.7–4.0)
MCH: 30.2 pg (ref 26.0–34.0)
MCHC: 36.2 g/dL — ABNORMAL HIGH (ref 30.0–36.0)
MCV: 83.4 fL (ref 80.0–100.0)
Monocytes Absolute: 0.6 10*3/uL (ref 0.1–1.0)
Monocytes Relative: 4 %
Neutro Abs: 8.3 10*3/uL — ABNORMAL HIGH (ref 1.7–7.7)
Neutrophils Relative %: 60 %
Platelets: 259 10*3/uL (ref 150–400)
RBC: 4.27 MIL/uL (ref 3.87–5.11)
RDW: 13.8 % (ref 11.5–15.5)
WBC: 14.1 10*3/uL — ABNORMAL HIGH (ref 4.0–10.5)
nRBC: 0 % (ref 0.0–0.2)

## 2020-06-10 LAB — KAPPA/LAMBDA LIGHT CHAINS
Kappa free light chain: 47.6 mg/L — ABNORMAL HIGH (ref 3.3–19.4)
Kappa, lambda light chain ratio: 4.67 — ABNORMAL HIGH (ref 0.26–1.65)
Lambda free light chains: 10.2 mg/L (ref 5.7–26.3)

## 2020-06-10 LAB — IGG, IGA, IGM
IgA: 618 mg/dL — ABNORMAL HIGH (ref 87–352)
IgG (Immunoglobin G), Serum: 684 mg/dL (ref 586–1602)
IgM (Immunoglobulin M), Srm: 28 mg/dL (ref 26–217)

## 2020-06-10 LAB — PROTEIN ELECTROPHORESIS, SERUM
A/G Ratio: 1.3 (ref 0.7–1.7)
Albumin ELP: 3.5 g/dL (ref 2.9–4.4)
Alpha-1-Globulin: 0.2 g/dL (ref 0.0–0.4)
Alpha-2-Globulin: 0.7 g/dL (ref 0.4–1.0)
Beta Globulin: 1.2 g/dL (ref 0.7–1.3)
Gamma Globulin: 0.6 g/dL (ref 0.4–1.8)
Globulin, Total: 2.8 g/dL (ref 2.2–3.9)
Total Protein ELP: 6.3 g/dL (ref 6.0–8.5)

## 2020-06-12 NOTE — Progress Notes (Signed)
Allison Martinez  [phone] [phone]  ID: Allison Martinez OB: 02-03-1976  MR#: [id number]  KNL#:[id number]  Patient Care Team: [name], MD as PCP - General (Family Medicine)   CHIEF COMPLAINT: MGUS, leukocytosis.  INTERVAL HISTORY: Patient returns to clinic today for routine yearly evaluation and discussion of her laboratory results.  She currently feels well and is asymptomatic.  She recently underwent partial hysterectomy.  She has no neurologic complaints. She denies any recent fevers or illnesses.  She denies any chest pain, shortness of breath, cough, or hemoptysis.  She denies any nausea, vomiting, constipation, or diarrhea. She has no urinary complaints.  Patient offers no specific complaints today.  REVIEW OF SYSTEMS:   Review of Systems  Constitutional: Negative.  Negative for fever, malaise/fatigue and weight loss.  Respiratory: Negative.  Negative for cough and sputum production.   Cardiovascular: Negative.  Negative for chest pain and leg swelling.  Gastrointestinal: Negative.  Negative for abdominal pain.  Genitourinary: Negative.   Musculoskeletal: Negative.  Negative for back pain, joint pain and neck pain.  Skin: Negative.  Negative for rash.  Neurological: Negative.  Negative for sensory change, focal weakness and weakness.  Psychiatric/Behavioral: Negative.  The patient is not nervous/anxious.     As per HPI. Otherwise, a complete review of systems is negative.  PAST MEDICAL HISTORY: Past Medical History:  Diagnosis Date  . Achilles tendonitis   . Anemia   . Anxiety   . Arthritis    back and neck  . Asthma   . Bandemia   . Complication of anesthesia    hard time waking up after neck surgery and 2 breathing txs during surgery  . Diverticulitis   . Dysrhythmia   . Fibromyalgia 06/07/2017  . GERD (gastroesophageal reflux disease)   . H/O hiatal hernia    small  . Headache(784.0)   . Hypertension   .  Hypothyroidism   . IBS (irritable bowel syndrome)   . MGUS (monoclonal gammopathy of unknown significance)    SEEN AT [facility]CER CENTER EVERY 6 MONTHS  . Migraine    complex  . Mitral valve prolapse   . Murmur, cardiac   . Pneumonia   . Seizure ([place])    ONLY AS A BABY  . 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)    PT DENIES RESIDUAL EFFECTS-THIS HAPPENED AT AGE 73 PER PT    PAST SURGICAL HISTORY: Past Surgical History:  Procedure Laterality Date  . ANTERIOR CERVICAL DECOMP/DISCECTOMY FUSION    . APPENDECTOMY    . CHOLECYSTECTOMY    . LAPAROSCOPY  12/15/2012   Procedure: LAPAROSCOPY DIAGNOSTIC;  Surgeon: Lovenia Kim, MD;  Location: [facility];  Service: Gynecology;  Laterality: N/A;  . MOUTH SURGERY    . ROBOTIC ASSISTED TOTAL HYSTERECTOMY WITH BILATERAL SALPINGO OOPHERECTOMY Bilateral 02/25/2020   Procedure: XI ROBOTIC ASSISTED TOTAL HYSTERECTOMY WITH BILATERAL SALPINGECTOMY;  Surgeon: Ward, Honor Loh, MD;  Location: [facility];  Service: Gynecology;  Laterality: Bilateral;    FAMILY HISTORY Family History  Problem Relation Age of Onset  . Arrhythmia Mother   . Hypertension Mother   . Heart disease Father        ADVANCED DIRECTIVES:    HEALTH MAINTENANCE: Social History   Tobacco Use  . Smoking status: Current Every Day Smoker    Packs/day: 0.50    Years: 6.00    Pack years: 3.00    Types: Cigarettes  . Smokeless tobacco: Never Used  Vaping Use  . Vaping Use: Never  used  Substance Use Topics  . Alcohol use: Yes    Comment: occasionally  . Drug use: No     Colonoscopy:  PAP:  Bone density:  Lipid panel:  Allergies  Allergen Reactions  . Fish-Derived Products Anaphylaxis  . Shellfish Allergy Anaphylaxis  . Sulfa Antibiotics Anaphylaxis  . Tramadol Hcl Shortness Of Breath and Nausea Only    Passed out  . Codeine Itching  . Tape     PAPER TAPE OK TO USE  . Tylenol [Acetaminophen]     Cant take tylenol #3  . Cefdinir Rash    Can take amoxicillin without problems     Current Outpatient Medications  Medication Sig Dispense Refill  . albuterol (VENTOLIN HFA) 108 (90 Base) MCG/ACT inhaler Inhale 1 puff into the lungs every morning. And PRN    . bisoprolol (ZEBETA) 5 MG tablet Take 2.5 mg by mouth every morning.     . hydrochlorothiazide (HYDRODIURIL) 25 MG tablet Take 25 mg by mouth daily.    Marland Kitchen ibuprofen (ADVIL) 800 MG tablet Take 1 tablet (800 mg total) by mouth every 6 (six) hours. 45 tablet 1  . levothyroxine (SYNTHROID, LEVOTHROID) 75 MCG tablet Take 75 mcg by mouth daily before breakfast.     . potassium chloride SA (K-DUR,KLOR-CON) 20 MEQ tablet Take 1 tablet (20 mEq total) by mouth 2 (two) times daily. (Patient taking differently: Take 20 mEq by mouth daily. ) 60 tablet 1  . vitamin B-12 (CYANOCOBALAMIN) 500 MCG tablet Take 500 mcg by mouth daily. (Patient not taking: Reported on 06/13/2020)    . VITAMIN D, CHOLECALCIFEROL, PO Take 1 capsule by mouth daily. (Patient not taking: Reported on 06/13/2020)     No current facility-administered medications for this visit.    OBJECTIVE: Vitals:   06/16/20 1435  BP: (!) 146/98  Pulse: 85  Resp: 18  Temp: 98.5 F (36.9 C)  SpO2: 99%     Body mass index is 50.43 kg/m.    ECOG FS:0 - Asymptomatic  General: Well-developed, well-nourished, no acute distress. Eyes: Pink conjunctiva, anicteric sclera. HEENT: Normocephalic, moist mucous membranes. Lungs: No audible wheezing or coughing. Heart: Regular rate and rhythm. Abdomen: Soft, nontender, no obvious distention. Musculoskeletal: No edema, cyanosis, or clubbing. Neuro: Alert, answering all questions appropriately. Cranial nerves grossly intact. Skin: No rashes or petechiae noted. Psych: Normal affect.   LAB RESULTS:  Lab Results  Component Value Date   NA 137 06/09/2020   K 3.2 (L) 06/09/2020   CL 102 06/09/2020   CO2 25 06/09/2020   GLUCOSE 113 (H) 06/09/2020   BUN 8 06/09/2020   CREATININE 1.11 (H) 06/09/2020   CALCIUM 8.4 (L)  06/09/2020   PROT 6.9 06/07/2017   ALBUMIN 4.0 06/07/2017   AST 17 06/07/2017   ALT 19 06/07/2017   ALKPHOS 88 06/07/2017   BILITOT 0.2 06/07/2017   GFRNONAA >60 06/09/2020   GFRAA >60 06/09/2020    Lab Results  Component Value Date   WBC 14.1 (H) 06/09/2020   NEUTROABS 8.3 (H) 06/09/2020   HGB 12.9 06/09/2020   HCT 35.6 (L) 06/09/2020   MCV 83.4 06/09/2020   PLT 259 06/09/2020   Lab Results  Component Value Date   TOTALPROTELP 6.3 06/09/2020   ALBUMINELP 3.5 06/09/2020   A1GS 0.2 06/09/2020   A2GS 0.7 06/09/2020   BETS 1.2 06/09/2020   GAMS 0.6 06/09/2020   MSPIKE Not Observed 06/09/2020   SPEI Comment 06/09/2020     STUDIES: No results found.  ASSESSMENT: MGUS, leukocytosis.  PLAN:    1. MGUS: Patient no longer has an M spike on SPEP.  Previously, her M spike ranged from 0.3-0.5 since February 2017.  Her IgA component remains persistently elevated between 600 and 700.  Her kappa free light chains also remain mildly elevated ranging between 35 and 50.  Metastatic bone survey completed on February 17, 2016 reviewed independently with no obvious lesions.  She continues to have no evidence of endorgan damage.  No intervention is needed at this time.  Patient does not require bone marrow biopsy.  Return to clinic in 1 year with repeat laboratory work and video assisted telemedicine visit.  2. Leukocytosis: Chronic and unchanged.  Patient's white blood cell count is persistently elevated, but approximately her baseline. Her white count has ranged from 12.2-21.3 since at least September 2001. Previously, BCR-ABL and peripheral blood flow cytometry were negative.  Repeat peripheral blood flow cytometry is negative as well.  3.  Hypokalemia: Recommended dietary changes.    Patient expressed understanding and was in agreement with this plan. She also understands that She can call clinic at any time with any questions, concerns, or complaints.   Lloyd Huger, MD    06/17/2020 6:55 AM

## 2020-06-13 ENCOUNTER — Encounter: Payer: Self-pay | Admitting: Oncology

## 2020-06-13 NOTE — Progress Notes (Signed)
Pt reports that she had a hysterectomy in March. Is wondering if she needs to come in on Monday.

## 2020-06-16 ENCOUNTER — Inpatient Hospital Stay (HOSPITAL_BASED_OUTPATIENT_CLINIC_OR_DEPARTMENT_OTHER): Payer: Managed Care, Other (non HMO) | Admitting: Oncology

## 2020-06-16 VITALS — BP 146/98 | HR 85 | Temp 98.5°F | Resp 18 | Wt 266.9 lb

## 2020-06-16 DIAGNOSIS — D472 Monoclonal gammopathy: Secondary | ICD-10-CM | POA: Diagnosis not present

## 2020-06-24 ENCOUNTER — Other Ambulatory Visit: Payer: Self-pay

## 2020-06-24 ENCOUNTER — Ambulatory Visit: Payer: Managed Care, Other (non HMO) | Admitting: Physician Assistant

## 2020-06-24 VITALS — BP 142/80 | HR 90 | Temp 98.8°F | Resp 18 | Ht 61.0 in | Wt 266.0 lb

## 2020-06-24 DIAGNOSIS — H9202 Otalgia, left ear: Secondary | ICD-10-CM

## 2020-06-24 NOTE — Progress Notes (Signed)
Subjective:    Patient ID: Allison Martinez, female    DOB: November 09, 1976, 44 y.o.   MRN: [id number]  HPI  44 yo F presents very concerned about having a bug in her left ear- elaborates with concerns about " a bug or a bee or a black widow spider". Was sitting under a tree on Saturday watching fireworks when she felt something near her ear- putting her finger inside canal she withdrew with a tiny black fragment noted under her nail- no identification made re: living or dead, bug or dirt fragment. She has been very anxious about the experience for the past 2 days. Repeatedly reaches with left hand and palpates left ear/ear lobe/ant cervical chain reporting tenderness.    Reports a childhood hx of repeated earaches  Reports extensive history of allergic reactions to medications, multiple classified as anapylaxis  Review of Systems No fever, no sore throat, no SOB, no headache or malaise since experience. Has developed tenderness on her neck in area of touch    TYLENOL reported as allergy in past but she reports taking both tylenol and ibuprofen as need for pain when needed.  Objective:   Physical Exam Vitals and nursing note reviewed.  Constitutional:      Appearance: Normal appearance.     Comments: BMI 50.26 anxious  HENT:     Head: Normocephalic and atraumatic.     Left Ear: Tympanic membrane, ear canal and external ear normal. There is no impacted cerumen.     Ears:     Comments: Canal clear. TM negative, well visualized,  No erythema  No retraction , no fluid level. No evidence of trauma or foreign body noted. Single tiny pink spot less than a mm  In inferior canal, no induration, no exudate . Mild deviation canal as approaches TM    Nose: Nose normal.     Mouth/Throat:     Mouth: Mucous membranes are moist.  Eyes:     General:        Left eye: No discharge.     Extraocular Movements: Extraocular movements intact.     Conjunctiva/sclera: Conjunctivae normal.  Neck:      Comments: Left anterior neck with pale pink skin changes along the anterior cervical area she repeatedly touches, presses on, and deep searches- expressing concern about tenderness. Witnessed a minimum of 8 times within few initial minutes of intake interview Cardiovascular:     Rate and Rhythm: Normal rate.  Pulmonary:     Effort: Pulmonary effort is normal.     Breath sounds: Normal breath sounds.  Musculoskeletal:        General: Normal range of motion.     Cervical back: Normal range of motion and neck supple. Tenderness present. No rigidity.     Comments: Neck negative except for pressure marks noted previously   Lymphadenopathy:     Cervical: No cervical adenopathy.  Skin:    General: Skin is warm and dry.     Capillary Refill: Capillary refill takes less than 2 seconds.  Neurological:     Mental Status: She is alert and oriented to person, place, and time.  Psychiatric:     Comments: Anxious about perceived possibilites especially because she can't see into ear- boyfriend was unable to calm her, by her own report       Assessment & Plan:   Ear exam normal, no injury or FB noted Touch tenderness left neck  Worried well, anxiety  Auralgan reported as no longer  available but Sweet oil can be OTC available per pharmacist  Has taken 4 tylenol without change Discussed using Rx per label instructions ( she doubled Rx because of her weight) Has hx of reported as allergy - but patient states uses tylenol and or ibuprofen as needed.  Will put typist finger cots on first 2 fingers of  left hand for increased awareness- and make every effort not to probe area additionally.     Follow up call tomorrow if increased concern- report back Thursday phone message with update, Revisit prn.

## 2020-08-27 ENCOUNTER — Other Ambulatory Visit: Payer: Managed Care, Other (non HMO)

## 2020-09-10 ENCOUNTER — Other Ambulatory Visit
Admission: RE | Admit: 2020-09-10 | Discharge: 2020-09-10 | Disposition: A | Discharge status: Home / Self Care | Payer: Managed Care, Other (non HMO) | Source: Ambulatory Visit | Attending: Gastroenterology | Admitting: Gastroenterology

## 2020-09-10 ENCOUNTER — Other Ambulatory Visit: Payer: Self-pay

## 2020-09-10 DIAGNOSIS — Z20822 Contact with and (suspected) exposure to covid-19: Secondary | ICD-10-CM | POA: Insufficient documentation

## 2020-09-10 DIAGNOSIS — Z01812 Encounter for preprocedural laboratory examination: Secondary | ICD-10-CM | POA: Diagnosis present

## 2020-09-10 LAB — SARS CORONAVIRUS 2 (TAT 6-24 HRS): SARS Coronavirus 2: NEGATIVE

## 2020-09-11 ENCOUNTER — Encounter: Payer: Self-pay | Admitting: *Deleted

## 2020-09-12 ENCOUNTER — Encounter
Admission: RE | Disposition: A | Discharge status: Home / Self Care | Payer: Self-pay | Source: Home / Self Care | Attending: Gastroenterology

## 2020-09-12 ENCOUNTER — Ambulatory Visit
Admission: RE | Admit: 2020-09-12 | Discharge: 2020-09-12 | Disposition: A | Discharge status: Home / Self Care | Payer: Managed Care, Other (non HMO) | Attending: Gastroenterology | Admitting: Gastroenterology

## 2020-09-12 ENCOUNTER — Ambulatory Visit: Payer: Managed Care, Other (non HMO) | Admitting: Anesthesiology

## 2020-09-12 ENCOUNTER — Other Ambulatory Visit: Payer: Self-pay

## 2020-09-12 ENCOUNTER — Encounter: Payer: Self-pay | Admitting: *Deleted

## 2020-09-12 DIAGNOSIS — K640 First degree hemorrhoids: Secondary | ICD-10-CM | POA: Diagnosis not present

## 2020-09-12 DIAGNOSIS — Z7951 Long term (current) use of inhaled steroids: Secondary | ICD-10-CM | POA: Diagnosis not present

## 2020-09-12 DIAGNOSIS — Z791 Long term (current) use of non-steroidal anti-inflammatories (NSAID): Secondary | ICD-10-CM | POA: Diagnosis not present

## 2020-09-12 DIAGNOSIS — M1990 Unspecified osteoarthritis, unspecified site: Secondary | ICD-10-CM | POA: Insufficient documentation

## 2020-09-12 DIAGNOSIS — Z885 Allergy status to narcotic agent status: Secondary | ICD-10-CM | POA: Insufficient documentation

## 2020-09-12 DIAGNOSIS — E039 Hypothyroidism, unspecified: Secondary | ICD-10-CM | POA: Diagnosis not present

## 2020-09-12 DIAGNOSIS — K5730 Diverticulosis of large intestine without perforation or abscess without bleeding: Secondary | ICD-10-CM | POA: Diagnosis not present

## 2020-09-12 DIAGNOSIS — K5900 Constipation, unspecified: Secondary | ICD-10-CM | POA: Diagnosis not present

## 2020-09-12 DIAGNOSIS — I10 Essential (primary) hypertension: Secondary | ICD-10-CM | POA: Insufficient documentation

## 2020-09-12 DIAGNOSIS — R1013 Epigastric pain: Secondary | ICD-10-CM | POA: Insufficient documentation

## 2020-09-12 DIAGNOSIS — K589 Irritable bowel syndrome without diarrhea: Secondary | ICD-10-CM | POA: Insufficient documentation

## 2020-09-12 DIAGNOSIS — Z86010 Personal history of colonic polyps: Secondary | ICD-10-CM | POA: Diagnosis not present

## 2020-09-12 DIAGNOSIS — K449 Diaphragmatic hernia without obstruction or gangrene: Secondary | ICD-10-CM | POA: Insufficient documentation

## 2020-09-12 DIAGNOSIS — Z91013 Allergy to seafood: Secondary | ICD-10-CM | POA: Insufficient documentation

## 2020-09-12 DIAGNOSIS — K219 Gastro-esophageal reflux disease without esophagitis: Secondary | ICD-10-CM | POA: Insufficient documentation

## 2020-09-12 DIAGNOSIS — Z882 Allergy status to sulfonamides status: Secondary | ICD-10-CM | POA: Insufficient documentation

## 2020-09-12 DIAGNOSIS — Z90710 Acquired absence of both cervix and uterus: Secondary | ICD-10-CM | POA: Diagnosis not present

## 2020-09-12 DIAGNOSIS — R002 Palpitations: Secondary | ICD-10-CM | POA: Insufficient documentation

## 2020-09-12 DIAGNOSIS — Z79899 Other long term (current) drug therapy: Secondary | ICD-10-CM | POA: Insufficient documentation

## 2020-09-12 DIAGNOSIS — Z881 Allergy status to other antibiotic agents status: Secondary | ICD-10-CM | POA: Diagnosis not present

## 2020-09-12 DIAGNOSIS — F419 Anxiety disorder, unspecified: Secondary | ICD-10-CM | POA: Insufficient documentation

## 2020-09-12 DIAGNOSIS — K317 Polyp of stomach and duodenum: Secondary | ICD-10-CM | POA: Insufficient documentation

## 2020-09-12 DIAGNOSIS — Z6841 Body Mass Index (BMI) 40.0 and over, adult: Secondary | ICD-10-CM | POA: Insufficient documentation

## 2020-09-12 DIAGNOSIS — R1030 Lower abdominal pain, unspecified: Secondary | ICD-10-CM | POA: Diagnosis not present

## 2020-09-12 DIAGNOSIS — M797 Fibromyalgia: Secondary | ICD-10-CM | POA: Diagnosis not present

## 2020-09-12 DIAGNOSIS — K319 Disease of stomach and duodenum, unspecified: Secondary | ICD-10-CM | POA: Diagnosis not present

## 2020-09-12 DIAGNOSIS — R011 Cardiac murmur, unspecified: Secondary | ICD-10-CM | POA: Insufficient documentation

## 2020-09-12 DIAGNOSIS — D472 Monoclonal gammopathy: Secondary | ICD-10-CM | POA: Insufficient documentation

## 2020-09-12 DIAGNOSIS — Z888 Allergy status to other drugs, medicaments and biological substances status: Secondary | ICD-10-CM | POA: Diagnosis not present

## 2020-09-12 DIAGNOSIS — Z886 Allergy status to analgesic agent status: Secondary | ICD-10-CM | POA: Insufficient documentation

## 2020-09-12 DIAGNOSIS — J45909 Unspecified asthma, uncomplicated: Secondary | ICD-10-CM | POA: Insufficient documentation

## 2020-09-12 DIAGNOSIS — D124 Benign neoplasm of descending colon: Secondary | ICD-10-CM | POA: Diagnosis not present

## 2020-09-12 DIAGNOSIS — G43909 Migraine, unspecified, not intractable, without status migrainosus: Secondary | ICD-10-CM | POA: Insufficient documentation

## 2020-09-12 DIAGNOSIS — I4891 Unspecified atrial fibrillation: Secondary | ICD-10-CM | POA: Insufficient documentation

## 2020-09-12 DIAGNOSIS — I341 Nonrheumatic mitral (valve) prolapse: Secondary | ICD-10-CM | POA: Insufficient documentation

## 2020-09-12 DIAGNOSIS — D649 Anemia, unspecified: Secondary | ICD-10-CM | POA: Insufficient documentation

## 2020-09-12 DIAGNOSIS — Z8673 Personal history of transient ischemic attack (TIA), and cerebral infarction without residual deficits: Secondary | ICD-10-CM | POA: Insufficient documentation

## 2020-09-12 DIAGNOSIS — F17200 Nicotine dependence, unspecified, uncomplicated: Secondary | ICD-10-CM | POA: Insufficient documentation

## 2020-09-12 HISTORY — DX: Endometriosis, unspecified: N80.9

## 2020-09-12 HISTORY — PX: ESOPHAGOGASTRODUODENOSCOPY: SHX5428

## 2020-09-12 HISTORY — PX: COLONOSCOPY: SHX5424

## 2020-09-12 SURGERY — EGD (ESOPHAGOGASTRODUODENOSCOPY)
Anesthesia: General

## 2020-09-12 MED ORDER — MIDAZOLAM HCL 2 MG/2ML IJ SOLN
INTRAMUSCULAR | Status: DC | PRN
  Administered 2020-09-12: .5 mg via INTRAVENOUS

## 2020-09-12 MED ORDER — PROPOFOL 10 MG/ML IV BOLUS
INTRAVENOUS | Status: AC
  Filled 2020-09-12: qty 20

## 2020-09-12 MED ORDER — ONDANSETRON HCL 4 MG/2ML IJ SOLN
INTRAMUSCULAR | Status: DC | PRN
  Administered 2020-09-12: 4 mg via INTRAVENOUS

## 2020-09-12 MED ORDER — FENTANYL CITRATE (PF) 100 MCG/2ML IJ SOLN
INTRAMUSCULAR | Status: AC
  Filled 2020-09-12: qty 2

## 2020-09-12 MED ORDER — PHENYLEPHRINE HCL (PRESSORS) 10 MG/ML IV SOLN
INTRAVENOUS | Status: DC | PRN
  Administered 2020-09-12: 100 ug via INTRAVENOUS
  Administered 2020-09-12: 200 ug via INTRAVENOUS

## 2020-09-12 MED ORDER — PROPOFOL 500 MG/50ML IV EMUL
INTRAVENOUS | Status: DC | PRN
  Administered 2020-09-12: 150 ug/kg/min via INTRAVENOUS

## 2020-09-12 MED ORDER — LIDOCAINE HCL (CARDIAC) PF 100 MG/5ML IV SOSY
PREFILLED_SYRINGE | INTRAVENOUS | Status: DC | PRN
  Administered 2020-09-12: 100 mg via INTRATRACHEAL

## 2020-09-12 MED ORDER — SODIUM CHLORIDE 0.9 % IV SOLN: INTRAVENOUS | Status: DC

## 2020-09-12 MED ORDER — PROPOFOL 10 MG/ML IV BOLUS
INTRAVENOUS | Status: DC | PRN
  Administered 2020-09-12 (×2): 20 mg via INTRAVENOUS
  Administered 2020-09-12: 50 mg via INTRAVENOUS

## 2020-09-12 MED ORDER — MIDAZOLAM HCL 2 MG/2ML IJ SOLN
INTRAMUSCULAR | Status: AC
  Filled 2020-09-12: qty 2

## 2020-09-12 MED ORDER — EPHEDRINE SULFATE 50 MG/ML IJ SOLN
INTRAMUSCULAR | Status: DC | PRN
  Administered 2020-09-12: 15 mg via INTRAVENOUS

## 2020-09-12 NOTE — Transfer of Care (Signed)
Immediate Anesthesia Transfer of Care Note  Patient: Allison Martinez  Procedure(s) Performed: ESOPHAGOGASTRODUODENOSCOPY (EGD) (N/A ) COLONOSCOPY (N/A )  Patient Location: PACU  Anesthesia Type:General  Level of Consciousness: awake, alert  and oriented  Airway & Oxygen Therapy: Patient Spontanous Breathing and Patient connected to nasal cannula oxygen  Post-op Assessment: Report given to RN and Post -op Vital signs reviewed and stable  Post vital signs: Reviewed and stable  Last Vitals:  Vitals Value Taken Time  BP 95/50 09/12/20 0951  Temp    Pulse 53 09/12/20 0953  Resp 21 09/12/20 0953  SpO2 98 % 09/12/20 0953  Vitals shown include unvalidated device data.  Last Pain:  Vitals:   09/12/20 0945  TempSrc:   PainSc: 0-No pain         Complications: No complications documented.

## 2020-09-12 NOTE — Anesthesia Procedure Notes (Signed)
Date/Time: 09/12/2020 9:10 AM Performed by: Allean Found, CRNA Pre-anesthesia Checklist: Patient identified, Emergency Drugs available, Suction available, Patient being monitored and Timeout performed Oxygen Delivery Method: Nasal cannula Placement Confirmation: positive ETCO2

## 2020-09-12 NOTE — Op Note (Signed)
[facility] Patient Name: Allison Martinez Procedure Date: 09/12/2020 8:23 AM MRN: [id number] Account #: [id number] Date of Birth: 1976/09/30 Admit Type: Outpatient Age: 44 Room: [facility] ENDO ROOM 3 Gender: Female Note Status: Finalized Procedure:             Upper GI endoscopy Indications:           Dyspepsia Providers:             Andrey Farmer MD, MD Medicines:             Monitored Anesthesia Care Complications:         No immediate complications. Estimated blood loss:                         Minimal. Procedure:             Pre-Anesthesia Assessment:                        - Prior to the procedure, a History and Physical was                         performed, and patient medications and allergies were                         reviewed. The patient is competent. The risks and                         benefits of the procedure and the sedation options and                         risks were discussed with the patient. All questions                         were answered and informed consent was obtained.                         Patient identification and proposed procedure were                         verified by the physician, the nurse, the anesthetist                         and the technician in the endoscopy suite. Mental                         Status Examination: alert and oriented. Airway                         Examination: normal oropharyngeal airway and neck                         mobility. Respiratory Examination: clear to                         auscultation. CV Examination: normal. Prophylactic                         Antibiotics: The patient does not require prophylactic  antibiotics. Prior Anticoagulants: The patient has                         taken no previous anticoagulant or antiplatelet                         agents. ASA Grade Assessment: III - A patient with                         severe systemic disease.  After reviewing the risks and                         benefits, the patient was deemed in satisfactory                         condition to undergo the procedure. The anesthesia                         plan was to use monitored anesthesia care (MAC).                         Immediately prior to administration of medications,                         the patient was re-assessed for adequacy to receive                         sedatives. The heart rate, respiratory rate, oxygen                         saturations, blood pressure, adequacy of pulmonary                         ventilation, and response to care were monitored                         throughout the procedure. The physical status of the                         patient was re-assessed after the procedure.                        After obtaining informed consent, the endoscope was                         passed under direct vision. Throughout the procedure,                         the patient's blood pressure, pulse, and oxygen                         saturations were monitored continuously. The Endoscope                         was introduced through the mouth, and advanced to the                         second part of duodenum. The upper GI endoscopy was  accomplished without difficulty. The patient tolerated                         the procedure well. Findings:      The examined esophagus was normal.      Hematin (altered blood/coffee-ground-like material) was found in the       gastric fundus and in the gastric body. Biopsies were taken with a cold       forceps for Helicobacter pylori testing. Estimated blood loss was       minimal.      A single 1 mm sessile polyp with no stigmata of recent bleeding was       found on the lesser curvature of the gastric body. The polyp was removed       with a cold biopsy forceps. Resection and retrieval were complete.       Estimated blood loss was minimal.      The  examined duodenum was normal. Impression:            - Normal esophagus.                        - Hematin (altered blood/coffee-ground-like material)                         in the gastric fundus and in the gastric body.                         Biopsied.                        - A single gastric polyp. Resected and retrieved.                        - Normal examined duodenum. Recommendation:        - Discharge patient to home.                        - Resume previous diet.                        - Continue present medications.                        - Await pathology results.                        - Return to referring physician as previously                         scheduled. Procedure Code(s):     --- Professional ---                        [phone], Esophagogastroduodenoscopy, flexible,                         transoral; with biopsy, single or multiple Diagnosis Code(s):     --- Professional ---                        K92.2, Gastrointestinal hemorrhage, unspecified  K31.7, Polyp of stomach and duodenum                        R10.13, Epigastric pain CPT copyright 2019 American Medical Association. All rights reserved. The codes documented in this report are preliminary and upon coder review may  be revised to meet current compliance requirements. Andrey Farmer, MD Andrey Farmer MD, MD 09/12/2020 9:42:21 AM Number of Addenda: 0 Note Initiated On: 09/12/2020 8:23 AM Estimated Blood Loss:  Estimated blood loss was minimal.      [facility]

## 2020-09-12 NOTE — Anesthesia Postprocedure Evaluation (Signed)
Anesthesia Post Note  Patient: Allison Martinez  Procedure(s) Performed: ESOPHAGOGASTRODUODENOSCOPY (EGD) (N/A ) COLONOSCOPY (N/A )  Patient location during evaluation: Endoscopy Anesthesia Type: General Level of consciousness: awake and alert Pain management: pain level controlled Vital Signs Assessment: post-procedure vital signs reviewed and stable Respiratory status: spontaneous breathing, nonlabored ventilation, respiratory function stable and patient connected to nasal cannula oxygen Cardiovascular status: blood pressure returned to baseline and stable Postop Assessment: no apparent nausea or vomiting Anesthetic complications: no   No complications documented.   Last Vitals:  Vitals:   09/12/20 1010 09/12/20 1020  BP: 107/76 112/66  Pulse: 65 64  Resp: (!) 24 16  Temp:    SpO2: 100% 97%    Last Pain:  Vitals:   09/12/20 1020  TempSrc:   PainSc: 0-No pain                 Martha Clan

## 2020-09-12 NOTE — Anesthesia Preprocedure Evaluation (Signed)
Anesthesia Evaluation  Patient identified by MRN, date of birth, ID band Patient awake    Reviewed: Allergy & Precautions, NPO status , Patient's Chart, lab work & pertinent test results  History of Anesthesia Complications (+) PROLONGED EMERGENCE and history of anesthetic complications (breathing problems during surgery)  Airway Mallampati: I       Dental  (+) Partial Lower, Edentulous Upper, Missing, Dental Advidsory Given   Pulmonary neg shortness of breath, asthma , neg sleep apnea, neg COPD, Recent URI , Resolved, Current Smoker and Patient abstained from smoking.,           Cardiovascular hypertension, Pt. on medications (-) angina(-) Past MI and (-) Cardiac Stents + dysrhythmias (palpitations) Atrial Fibrillation + Valvular Problems/Murmurs (murmur, no tx)      Neuro/Psych Seizures - (none since 44 yo),  Anxiety CVA (migraine with CVA symptoms)    GI/Hepatic Neg liver ROS, hiatal hernia, GERD  Medicated and Controlled,  Endo/Other  neg diabetesHypothyroidism Morbid obesity  Renal/GU negative Renal ROS     Musculoskeletal   Abdominal   Peds  Hematology  (+) Blood dyscrasia, anemia ,   Anesthesia Other Findings Past Medical History: No date: Achilles tendonitis No date: Anemia No date: Anxiety No date: Arthritis     Comment:  back and neck No date: Asthma No date: Bandemia No date: Complication of anesthesia     Comment:  hard time waking up after neck surgery and 2 breathing               txs during surgery No date: Diverticulitis No date: Dysrhythmia No date: Endometriosis 06/07/2017: Fibromyalgia No date: GERD (gastroesophageal reflux disease) No date: H/O hiatal hernia     Comment:  small No date: Headache(784.0) No date: Hypertension No date: Hypothyroidism No date: IBS (irritable bowel syndrome) No date: MGUS (monoclonal gammopathy of unknown significance)     Comment:  SEEN AT [facility]ER CENTER  EVERY 6 MONTHS No date: Migraine     Comment:  complex No date: Mitral valve prolapse No date: Mitral valve prolapse No date: Murmur, cardiac No date: Pneumonia No date: Seizure ([place])     Comment:  ONLY AS A BABY No date: Seizure ([place]) No date: 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[place])     Comment:  PT DENIES RESIDUAL EFFECTS-THIS HAPPENED AT AGE 21 PER               PT   Reproductive/Obstetrics                             Anesthesia Physical  Anesthesia Plan  ASA: III  Anesthesia Plan: General   Post-op Pain Management:    Induction: Intravenous  PONV Risk Score and Plan: 2 and Propofol infusion and TIVA  Airway Management Planned: Natural Airway and Nasal Cannula  Additional Equipment:   Intra-op Plan:   Post-operative Plan:   Informed Consent: I have reviewed the patients History and Physical, chart, labs and discussed the procedure including the risks, benefits and alternatives for the proposed anesthesia with the patient or authorized representative who has indicated his/her understanding and acceptance.       Plan Discussed with:   Anesthesia Plan Comments:         Anesthesia Quick Evaluation

## 2020-09-12 NOTE — Interval H&P Note (Signed)
History and Physical Interval Note:  09/12/2020 8:49 AM  Allison Martinez  has presented today for surgery, with the diagnosis of CHANGE IN BOOWEL HABITS,DYSPEPSIA.  The various methods of treatment have been discussed with the patient and family. After consideration of risks, benefits and other options for treatment, the patient has consented to  Procedure(s): ESOPHAGOGASTRODUODENOSCOPY (EGD) (N/A) COLONOSCOPY (N/A) as a surgical intervention.  The patient's history has been reviewed, patient examined, no change in status, stable for surgery.  I have reviewed the patient's chart and labs.  Questions were answered to the patient's satisfaction.     Lesly Rubenstein  Ok to proceed with EGD/Colonoscopy.

## 2020-09-12 NOTE — H&P (Signed)
Outpatient short stay form Pre-procedure 09/12/2020 8:45 AM Raylene Miyamoto MD, MPH  Primary Physician: Dr. Netty Starring  Reason for visit:  Dyspepsia/Constipation/surveillance  History of present illness:   44 y/o lady with history of polyps here for EGD/Colon for past history of polyps and constipation. Father with recently diagnosed with metastatic GIST. Also with dyspepsia. No blood thinners. History of recent hysterectomy.  No current facility-administered medications for this encounter.  Medications Prior to Admission  Medication Sig Dispense Refill Last Dose  . albuterol (VENTOLIN HFA) 108 (90 Base) MCG/ACT inhaler Inhale 1 puff into the lungs every morning. And PRN   09/12/2020 at 0600  . bisoprolol (ZEBETA) 5 MG tablet Take 2.5 mg by mouth every morning.    09/12/2020 at 0600  . hydrochlorothiazide (HYDRODIURIL) 25 MG tablet Take 25 mg by mouth daily.   09/11/2020 at Unknown time  . levothyroxine (SYNTHROID, LEVOTHROID) 75 MCG tablet Take 75 mcg by mouth daily before breakfast.    09/12/2020 at 0600  . omeprazole (PRILOSEC) 20 MG capsule Take 20 mg by mouth 2 (two) times daily before a meal.   Past Month at Unknown time  . acetaminophen (TYLENOL) 325 MG tablet Take 650 mg by mouth every 6 (six) hours as needed.     Marland Kitchen ibuprofen (ADVIL) 800 MG tablet Take 1 tablet (800 mg total) by mouth every 6 (six) hours. (Patient not taking: Reported on 06/24/2020) 45 tablet 1   . potassium chloride SA (K-DUR,KLOR-CON) 20 MEQ tablet Take 1 tablet (20 mEq total) by mouth 2 (two) times daily. (Patient taking differently: Take 20 mEq by mouth daily. ) 60 tablet 1 09/10/2020  . vitamin B-12 (CYANOCOBALAMIN) 500 MCG tablet Take 500 mcg by mouth daily. (Patient not taking: Reported on 06/13/2020)   09/10/2020  . VITAMIN D, CHOLECALCIFEROL, PO Take 1 capsule by mouth daily. (Patient not taking: Reported on 06/13/2020)   09/10/2020     Allergies  Allergen Reactions  . Fish-Derived Products Anaphylaxis  .  Shellfish Allergy Anaphylaxis  . Sulfa Antibiotics Anaphylaxis  . Tramadol Hcl Shortness Of Breath and Nausea Only    Passed out  . Codeine Itching  . Tape     PAPER TAPE OK TO USE  . Tylenol [Acetaminophen]     Cant take tylenol #3  . Cefdinir Rash    Can take amoxicillin without problems     Past Medical History:  Diagnosis Date  . Achilles tendonitis   . Anemia   . Anxiety   . Arthritis    back and neck  . Asthma   . Bandemia   . Complication of anesthesia    hard time waking up after neck surgery and 2 breathing txs during surgery  . Diverticulitis   . Dysrhythmia   . Endometriosis   . Fibromyalgia 06/07/2017  . GERD (gastroesophageal reflux disease)   . H/O hiatal hernia    small  . Headache(784.0)   . Hypertension   . Hypothyroidism   . IBS (irritable bowel syndrome)   . MGUS (monoclonal gammopathy of unknown significance)    SEEN AT CAN[facility]ER CENTER EVERY 6 MONTHS  . Migraine    complex  . Mitral valve prolapse   . Mitral valve prolapse   . Murmur, cardiac   . Pneumonia   . Seizure ([place])    ONLY AS A BABY  . Seizure ([place])   . 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    Physical Exam  Gen: Alert, oriented. Appears stated age.  HEENT: [place]/AT. PERRLA. Lungs: No respiratory distress Abd: soft, benign, no masses. Ext: No edema.     Planned procedures: Proceed with EGD/colonoscopy. The patient understands the nature of the planned procedure, indications, risks, alternatives and potential complications including but not limited to bleeding, infection, perforation, damage to internal organs and possible oversedation/side effects from anesthesia. The patient agrees and gives consent to proceed.  Please refer to procedure notes for findings, recommendations and patient disposition/instructions.     Raylene Miyamoto MD, MPH Gastroenterology 09/12/2020  8:45  AM

## 2020-09-12 NOTE — Op Note (Signed)
[facility] Gastroenterology Patient Name: Allison Martinez Procedure Date: 09/12/2020 8:23 AM MRN: [id number] Account #: [id number] Date of Birth: 1976-12-02 Admit Type: Outpatient Age: 44 Room: [facility] ENDO ROOM 3 Gender: Female Note Status: Finalized Procedure:             Colonoscopy Indications:           Lower abdominal pain Providers:             Andrey Farmer MD, MD Medicines:             Monitored Anesthesia Care Complications:         No immediate complications. Estimated blood loss:                         Minimal. Procedure:             Pre-Anesthesia Assessment:                        - Prior to the procedure, a History and Physical was                         performed, and patient medications and allergies were                         reviewed. The patient is competent. The risks and                         benefits of the procedure and the sedation options and                         risks were discussed with the patient. All questions                         were answered and informed consent was obtained.                         Patient identification and proposed procedure were                         verified by the physician, the nurse, the anesthetist                         and the technician in the endoscopy suite. Mental                         Status Examination: alert and oriented. Airway                         Examination: normal oropharyngeal airway and neck                         mobility. Respiratory Examination: clear to                         auscultation. CV Examination: normal. Prophylactic                         Antibiotics: The patient does not require prophylactic  antibiotics. Prior Anticoagulants: The patient has                         taken no previous anticoagulant or antiplatelet                         agents. ASA Grade Assessment: III - A patient with                         severe systemic  disease. After reviewing the risks and                         benefits, the patient was deemed in satisfactory                         condition to undergo the procedure. The anesthesia                         plan was to use monitored anesthesia care (MAC).                         Immediately prior to administration of medications,                         the patient was re-assessed for adequacy to receive                         sedatives. The heart rate, respiratory rate, oxygen                         saturations, blood pressure, adequacy of pulmonary                         ventilation, and response to care were monitored                         throughout the procedure. The physical status of the                         patient was re-assessed after the procedure.                        After obtaining informed consent, the colonoscope was                         passed under direct vision. Throughout the procedure,                         the patient's blood pressure, pulse, and oxygen                         saturations were monitored continuously. The                         Colonoscope was introduced through the anus and                         advanced to the the cecum, identified by appendiceal  orifice and ileocecal valve. The colonoscopy was                         performed without difficulty. The patient tolerated                         the procedure well. The quality of the bowel                         preparation was excellent. Findings:      The perianal and digital rectal examinations were normal.      A few small-mouthed diverticula were found in the descending colon.      A 2 mm polyp was found in the descending colon. The polyp was sessile.       The polyp was removed with a jumbo cold forceps. Resection and retrieval       were complete. Estimated blood loss was minimal.      Non-bleeding internal hemorrhoids were found during retroflexion.  The       hemorrhoids were Grade I (internal hemorrhoids that do not prolapse).      The exam was otherwise without abnormality on direct and retroflexion       views. Impression:            - Diverticulosis in the descending colon.                        - One 2 mm polyp in the descending colon, removed with                         a jumbo cold forceps. Resected and retrieved.                        - Non-bleeding internal hemorrhoids.                        - The examination was otherwise normal on direct and                         retroflexion views. Recommendation:        - Discharge patient to home.                        - Resume previous diet.                        - Continue present medications.                        - Await pathology results.                        - Repeat colonoscopy in 5 years for surveillance.                        - Return to referring physician as previously                         scheduled. Procedure Code(s):     --- Professional ---  45380, Colonoscopy, flexible; with biopsy, single or                         multiple Diagnosis Code(s):     --- Professional ---                        K64.0, First degree hemorrhoids                        K63.5, Polyp of colon                        R10.30, Lower abdominal pain, unspecified                        K57.30, Diverticulosis of large intestine without                         perforation or abscess without bleeding CPT copyright 2019 American Medical Association. All rights reserved. The codes documented in this report are preliminary and upon coder review may  be revised to meet current compliance requirements. Andrey Farmer, MD Andrey Farmer MD, MD 09/12/2020 9:46:12 AM Number of Addenda: 0 Note Initiated On: 09/12/2020 8:23 AM Scope Withdrawal Time: 0 hours 6 minutes 11 seconds  Total Procedure Duration: 0 hours 11 minutes 56 seconds  Estimated Blood Loss:  Estimated  blood loss was minimal.      [facility]

## 2020-09-16 LAB — SURGICAL PATHOLOGY

## 2021-02-05 ENCOUNTER — Other Ambulatory Visit
Admission: RE | Admit: 2021-02-05 | Discharge: 2021-02-05 | Disposition: A | Discharge status: Home / Self Care | Payer: Managed Care, Other (non HMO) | Source: Ambulatory Visit | Attending: Family Medicine | Admitting: Family Medicine

## 2021-02-05 DIAGNOSIS — R0789 Other chest pain: Secondary | ICD-10-CM | POA: Insufficient documentation

## 2021-02-05 DIAGNOSIS — Z03818 Encounter for observation for suspected exposure to other biological agents ruled out: Secondary | ICD-10-CM | POA: Insufficient documentation

## 2021-02-05 LAB — D-DIMER, QUANTITATIVE: D-Dimer, Quant: <0.27 ug/mL-FEU (ref 0.00–0.50)

## 2021-06-05 ENCOUNTER — Encounter: Payer: Self-pay | Admitting: Oncology

## 2021-06-05 DIAGNOSIS — U071 COVID-19: Secondary | ICD-10-CM | POA: Insufficient documentation

## 2021-06-08 ENCOUNTER — Telehealth: Payer: Self-pay | Admitting: Oncology

## 2021-06-08 NOTE — Telephone Encounter (Signed)
Left VM with patient in regards to her MyChart message about testing pos for COVID and requesting to r/s her appts.

## 2021-06-09 ENCOUNTER — Other Ambulatory Visit: Payer: Self-pay | Admitting: *Deleted

## 2021-06-09 ENCOUNTER — Inpatient Hospital Stay: Payer: Managed Care, Other (non HMO)

## 2021-06-09 DIAGNOSIS — D472 Monoclonal gammopathy: Secondary | ICD-10-CM

## 2021-06-16 ENCOUNTER — Inpatient Hospital Stay: Payer: Managed Care, Other (non HMO) | Admitting: Oncology

## 2021-07-10 ENCOUNTER — Inpatient Hospital Stay: Payer: Managed Care, Other (non HMO) | Attending: Oncology

## 2021-07-10 DIAGNOSIS — D72829 Elevated white blood cell count, unspecified: Secondary | ICD-10-CM | POA: Insufficient documentation

## 2021-07-10 DIAGNOSIS — D472 Monoclonal gammopathy: Secondary | ICD-10-CM | POA: Insufficient documentation

## 2021-07-10 LAB — BASIC METABOLIC PANEL
Anion gap: 9 (ref 5–15)
BUN: 11 mg/dL (ref 6–20)
CO2: 24 mmol/L (ref 22–32)
Calcium: 8.8 mg/dL — ABNORMAL LOW (ref 8.9–10.3)
Chloride: 103 mmol/L (ref 98–111)
Creatinine, Ser: 0.73 mg/dL (ref 0.44–1.00)
GFR, Estimated: >60 mL/min (ref >60)
Glucose, Bld: 104 mg/dL — ABNORMAL HIGH (ref 70–99)
Potassium: 3.3 mmol/L — ABNORMAL LOW (ref 3.5–5.1)
Sodium: 136 mmol/L (ref 135–145)

## 2021-07-10 LAB — CBC WITH DIFFERENTIAL/PLATELET
Abs Immature Granulocytes: 0.07 10*3/uL (ref 0.00–0.07)
Basophils Absolute: 0.1 10*3/uL (ref 0.0–0.1)
Basophils Relative: 0 %
Eosinophils Absolute: 0.5 10*3/uL (ref 0.0–0.5)
Eosinophils Relative: 3 %
HCT: 34.4 % — ABNORMAL LOW (ref 36.0–46.0)
Hemoglobin: 12.5 g/dL (ref 12.0–15.0)
Immature Granulocytes: 0 %
Lymphocytes Relative: 29 %
Lymphs Abs: 5.2 10*3/uL — ABNORMAL HIGH (ref 0.7–4.0)
MCH: 31.1 pg (ref 26.0–34.0)
MCHC: 36.3 g/dL — ABNORMAL HIGH (ref 30.0–36.0)
MCV: 85.6 fL (ref 80.0–100.0)
Monocytes Absolute: 0.9 10*3/uL (ref 0.1–1.0)
Monocytes Relative: 5 %
Neutro Abs: 11 10*3/uL — ABNORMAL HIGH (ref 1.7–7.7)
Neutrophils Relative %: 63 %
Platelets: 305 10*3/uL (ref 150–400)
RBC: 4.02 MIL/uL (ref 3.87–5.11)
RDW: 13.6 % (ref 11.5–15.5)
WBC: 17.6 10*3/uL — ABNORMAL HIGH (ref 4.0–10.5)
nRBC: 0 % (ref 0.0–0.2)

## 2021-07-11 LAB — IGG, IGA, IGM
IgA: 682 mg/dL — ABNORMAL HIGH (ref 87–352)
IgG (Immunoglobin G), Serum: 595 mg/dL (ref 586–1602)
IgM (Immunoglobulin M), Srm: 28 mg/dL (ref 26–217)

## 2021-07-13 LAB — PROTEIN ELECTROPHORESIS, SERUM
A/G Ratio: 1.3 (ref 0.7–1.7)
Albumin ELP: 3.9 g/dL (ref 2.9–4.4)
Alpha-1-Globulin: 0.2 g/dL (ref 0.0–0.4)
Alpha-2-Globulin: 0.8 g/dL (ref 0.4–1.0)
Beta Globulin: 1.4 g/dL — ABNORMAL HIGH (ref 0.7–1.3)
Gamma Globulin: 0.5 g/dL (ref 0.4–1.8)
Globulin, Total: 2.9 g/dL (ref 2.2–3.9)
Total Protein ELP: 6.8 g/dL (ref 6.0–8.5)

## 2021-07-13 LAB — KAPPA/LAMBDA LIGHT CHAINS
Kappa free light chain: 52.9 mg/L — ABNORMAL HIGH (ref 3.3–19.4)
Kappa, lambda light chain ratio: 4.48 — ABNORMAL HIGH (ref 0.26–1.65)
Lambda free light chains: 11.8 mg/L (ref 5.7–26.3)

## 2021-07-13 NOTE — Progress Notes (Signed)
[place]  [phone] Fax:([phone]) [phone]  ID: Allison Martinez OB: 31-Jul-1976  MR#: [id number]  QBH#:[id number]  Patient Care Team: [name], MD as PCP - General (Family Medicine)  I connected with Allison Martinez on 07/16/21 at  3:30 PM EDT by video enabled telemedicine visit and verified that I am speaking with the correct person using two identifiers.   I discussed the limitations, risks, security and privacy concerns of performing an evaluation and management service by telemedicine and the availability of in-person appointments. I also discussed with the patient that there may be a patient responsible charge related to this service. The patient expressed understanding and agreed to proceed.   Other persons participating in the visit and their role in the encounter: Patient, MD.  Patient's location: Home. Provider's location: Clinic.  CHIEF COMPLAINT: MGUS, leukocytosis.  INTERVAL HISTORY: Patient returns to clinic today for repeat laboratory work and routine yearly evaluation.  She continues to feel well and remains asymptomatic.  She has no neurologic complaints. She denies any recent fevers or illnesses.  She has a good appetite and denies weight loss.  She denies any chest pain, shortness of breath, cough, or hemoptysis.  She denies any nausea, vomiting, constipation, or diarrhea. She has no urinary complaints.  Patient feels at her baseline offers no specific complaints today.  REVIEW OF SYSTEMS:   Review of Systems  Constitutional: Negative.  Negative for fever, malaise/fatigue and weight loss.  Respiratory: Negative.  Negative for cough and sputum production.   Cardiovascular: Negative.  Negative for chest pain and leg swelling.  Gastrointestinal: Negative.  Negative for abdominal pain.  Genitourinary: Negative.   Musculoskeletal: Negative.  Negative for back pain, joint pain and neck pain.  Skin: Negative.  Negative for rash.   Neurological: Negative.  Negative for sensory change, focal weakness and weakness.  Psychiatric/Behavioral: Negative.  The patient is not nervous/anxious.    As per HPI. Otherwise, a complete review of systems is negative.  PAST MEDICAL HISTORY: Past Medical History:  Diagnosis Date   Achilles tendonitis    Anemia    Anxiety    Arthritis    back and neck   Asthma    Bandemia    Complication of anesthesia    hard time waking up after neck surgery and 2 breathing txs during surgery   Diverticulitis    Dysrhythmia    Endometriosis    Fibromyalgia 06/07/2017   GERD (gastroesophageal reflux disease)    H/O hiatal hernia    small   Headache(784.0)    Hypertension    Hypothyroidism    IBS (irritable bowel syndrome)    MGUS (monoclonal gammopathy of unknown significance)    SEEN AT CAN[facility]ER CENTER EVERY 6 MONTHS   Migraine    complex   Mitral valve prolapse    Mitral valve prolapse    Murmur, cardiac    Pneumonia    Seizure ([facility])    ONLY AS A BABY   Seizure ([place])    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[place])    PT DENIES RESIDUAL EFFECTS-THIS HAPPENED AT AGE 89 PER PT    PAST SURGICAL HISTORY: Past Surgical History:  Procedure Laterality Date   ABDOMINAL HYSTERECTOMY     ANTERIOR CERVICAL DECOMP/DISCECTOMY FUSION     APPENDECTOMY     CHOLECYSTECTOMY     COLONOSCOPY N/A 09/12/2020   Procedure: COLONOSCOPY;  Surgeon: Lesly Rubenstein, MD;  Location: [facility] ENDOSCOPY;  Service: Endoscopy;  Laterality: N/A;   ESOPHAGOGASTRODUODENOSCOPY N/A  09/12/2020   Procedure: ESOPHAGOGASTRODUODENOSCOPY (EGD);  Surgeon: Lesly Rubenstein, MD;  Location: [facility] ENDOSCOPY;  Service: Endoscopy;  Laterality: N/A;   LAPAROSCOPY  12/15/2012   Procedure: LAPAROSCOPY DIAGNOSTIC;  Surgeon: Lovenia Kim, MD;  Location: [place] [facility];  Service: Gynecology;  Laterality: N/A;   MOUTH SURGERY     ROBOTIC ASSISTED TOTAL HYSTERECTOMY WITH BILATERAL SALPINGO OOPHERECTOMY Bilateral 02/25/2020   Procedure: XI ROBOTIC ASSISTED  TOTAL HYSTERECTOMY WITH BILATERAL SALPINGECTOMY;  Surgeon: Ward, Honor Loh, MD;  Location: [facility];  Service: Gynecology;  Laterality: Bilateral;    FAMILY HISTORY Family History  Problem Relation Age of Onset   Arrhythmia Mother    Hypertension Mother    Heart disease Father        ADVANCED DIRECTIVES:    HEALTH MAINTENANCE: Social History   Tobacco Use   Smoking status: Every Day    Packs/day: 0.50    Years: 6.00    Pack years: 3.00    Types: Cigarettes   Smokeless tobacco: Never  Vaping Use   Vaping Use: Never used  Substance Use Topics   Alcohol use: Yes    Comment: occasionally   Drug use: No     Colonoscopy:  PAP:  Bone density:  Lipid panel:  Allergies  Allergen Reactions   Fish-Derived Products Anaphylaxis   Shellfish Allergy Anaphylaxis   Sulfa Antibiotics Anaphylaxis   Tramadol Hcl Shortness Of Breath and Nausea Only    Passed out   Codeine Itching   Tylenol [Acetaminophen]     Cant take tylenol #3   Cefdinir Rash    Can take amoxicillin without problems   Tape Rash    PAPER TAPE OK TO USE PAPER TAPE OK TO USE    Current Outpatient Medications  Medication Sig Dispense Refill   acetaminophen (TYLENOL) 325 MG tablet Take 650 mg by mouth every 6 (six) hours as needed.     albuterol (VENTOLIN HFA) 108 (90 Base) MCG/ACT inhaler Inhale 1 puff into the lungs every morning. And PRN     budesonide-formoterol (SYMBICORT) 160-4.5 MCG/ACT inhaler Inhale into the lungs.     carvedilol (COREG) 6.25 MG tablet Take 6.25 mg by mouth 2 (two) times daily.     hydrochlorothiazide (HYDRODIURIL) 25 MG tablet Take 25 mg by mouth daily.     levothyroxine (SYNTHROID, LEVOTHROID) 75 MCG tablet Take 75 mcg by mouth daily before breakfast.      losartan (COZAAR) 100 MG tablet Take 100 mg by mouth at bedtime.     omeprazole (PRILOSEC) 20 MG capsule Take 20 mg by mouth 2 (two) times daily before a meal.     vitamin B-12 (CYANOCOBALAMIN) 500 MCG tablet Take 500 mcg by  mouth daily. (Patient not taking: No sig reported)     VITAMIN D, CHOLECALCIFEROL, PO Take 1 capsule by mouth daily. (Patient not taking: No sig reported)     No current facility-administered medications for this visit.    OBJECTIVE: There were no vitals filed for this visit.    There is no height or weight on file to calculate BMI.    ECOG FS:0 - Asymptomatic  General: Well-developed, well-nourished, no acute distress. HEENT: Normocephalic. Neuro: Alert, answering all questions appropriately. Cranial nerves grossly intact. Psych: Normal affect.   LAB RESULTS:  Lab Results  Component Value Date   NA 136 07/10/2021   K 3.3 (L) 07/10/2021   CL 103 07/10/2021   CO2 24 07/10/2021   GLUCOSE 104 (H) 07/10/2021   BUN 11 07/10/2021  CREATININE 0.73 07/10/2021   CALCIUM 8.8 (L) 07/10/2021   PROT 6.9 06/07/2017   ALBUMIN 4.0 06/07/2017   AST 17 06/07/2017   ALT 19 06/07/2017   ALKPHOS 88 06/07/2017   BILITOT 0.2 06/07/2017   GFRNONAA >60 07/10/2021   GFRAA >60 06/09/2020    Lab Results  Component Value Date   WBC 17.6 (H) 07/10/2021   NEUTROABS 11.0 (H) 07/10/2021   HGB 12.5 07/10/2021   HCT 34.4 (L) 07/10/2021   MCV 85.6 07/10/2021   PLT 305 07/10/2021   Lab Results  Component Value Date   TOTALPROTELP 6.8 07/10/2021   ALBUMINELP 3.9 07/10/2021   A1GS 0.2 07/10/2021   A2GS 0.8 07/10/2021   BETS 1.4 (H) 07/10/2021   GAMS 0.5 07/10/2021   MSPIKE Not Observed 07/10/2021   SPEI Comment 07/10/2021     STUDIES: No results found.  ASSESSMENT: MGUS, leukocytosis.  PLAN:    1. MGUS: Patient no longer has an M spike on SPEP.  Previously, her M spike ranged from 0.3-0.5.  Her IgA component remains persistently elevated between 600 and 700.  Today's result was 682.  Her kappa free light chains also remain mildly elevated ranging between 35 and 55.  Today's result is 52.9.  Metastatic bone survey completed on February 17, 2016 reviewed independently with no obvious  lesions.  She continues to have no evidence of endorgan damage.  No intervention is needed at this time.  Patient does not require bone marrow biopsy.  Return to clinic in 1 year with repeat laboratory work and video assisted telemedicine visit.  Plan to continue monitoring patient for 2 more years and if all of her laboratory work remains unchanged, she likely can be discharged from clinic. 2. Leukocytosis: Chronic and unchanged.  Patient's white blood cell count is persistently elevated, but approximately her baseline. Her white count has ranged from 12.2-21.3 since at least September 2001.  Today's result was 17.6.  Previously, BCR-ABL and peripheral blood flow cytometry were negative.  Repeat peripheral blood flow cytometry is negative as well.  No further intervention is needed.  No bone marrow biopsy necessary as above. 3.  Hypokalemia: Recommended dietary changes.  I provided 20 minutes of face-to-face video visit time during this encounter which included chart review, counseling, and coordination of care as documented above.   Patient expressed understanding and was in agreement with this plan. She also understands that She can call clinic at any time with any questions, concerns, or complaints.   Lloyd Huger, MD   07/16/2021 6:26 AM

## 2021-07-14 ENCOUNTER — Inpatient Hospital Stay (HOSPITAL_BASED_OUTPATIENT_CLINIC_OR_DEPARTMENT_OTHER): Payer: Managed Care, Other (non HMO) | Admitting: Oncology

## 2021-07-14 ENCOUNTER — Encounter: Payer: Self-pay | Admitting: Oncology

## 2021-07-14 DIAGNOSIS — D472 Monoclonal gammopathy: Secondary | ICD-10-CM | POA: Diagnosis not present

## 2021-07-14 NOTE — Progress Notes (Signed)
Patient states that she "feels full of fluid today"

## 2021-08-10 ENCOUNTER — Other Ambulatory Visit
Admission: RE | Admit: 2021-08-10 | Discharge: 2021-08-10 | Disposition: A | Discharge status: Home / Self Care | Payer: Managed Care, Other (non HMO) | Source: Ambulatory Visit | Attending: Family Medicine | Admitting: Family Medicine

## 2021-08-10 DIAGNOSIS — R0789 Other chest pain: Secondary | ICD-10-CM | POA: Diagnosis not present

## 2021-08-10 LAB — D-DIMER, QUANTITATIVE: D-Dimer, Quant: <0.27 ug/mL-FEU (ref 0.00–0.50)

## 2021-12-23 ENCOUNTER — Other Ambulatory Visit: Payer: Self-pay | Admitting: Family Medicine

## 2021-12-23 DIAGNOSIS — Z1231 Encounter for screening mammogram for malignant neoplasm of breast: Secondary | ICD-10-CM

## 2022-05-11 ENCOUNTER — Ambulatory Visit
Admission: RE | Admit: 2022-05-11 | Discharge: 2022-05-11 | Disposition: A | Discharge status: Home / Self Care | Payer: Managed Care, Other (non HMO) | Source: Ambulatory Visit | Attending: Student | Admitting: Student

## 2022-05-11 ENCOUNTER — Other Ambulatory Visit: Payer: Self-pay | Admitting: Student

## 2022-05-11 DIAGNOSIS — M25562 Pain in left knee: Secondary | ICD-10-CM | POA: Diagnosis present

## 2022-05-11 DIAGNOSIS — M79662 Pain in left lower leg: Secondary | ICD-10-CM

## 2022-05-20 ENCOUNTER — Encounter: Payer: Self-pay | Admitting: Gastroenterology

## 2022-07-12 ENCOUNTER — Other Ambulatory Visit: Payer: Self-pay | Admitting: *Deleted

## 2022-07-12 ENCOUNTER — Inpatient Hospital Stay: Payer: Managed Care, Other (non HMO)

## 2022-07-12 ENCOUNTER — Other Ambulatory Visit: Payer: Managed Care, Other (non HMO)

## 2022-07-12 DIAGNOSIS — D472 Monoclonal gammopathy: Secondary | ICD-10-CM

## 2022-07-16 ENCOUNTER — Inpatient Hospital Stay: Payer: Managed Care, Other (non HMO) | Attending: Oncology

## 2022-07-16 DIAGNOSIS — D472 Monoclonal gammopathy: Secondary | ICD-10-CM | POA: Diagnosis present

## 2022-07-16 LAB — CBC WITH DIFFERENTIAL/PLATELET
Abs Immature Granulocytes: 0.08 10*3/uL — ABNORMAL HIGH (ref 0.00–0.07)
Basophils Absolute: 0 10*3/uL (ref 0.0–0.1)
Basophils Relative: 0 %
Eosinophils Absolute: 0.5 10*3/uL (ref 0.0–0.5)
Eosinophils Relative: 3 %
HCT: 35 % — ABNORMAL LOW (ref 36.0–46.0)
Hemoglobin: 12.6 g/dL (ref 12.0–15.0)
Immature Granulocytes: 1 %
Lymphocytes Relative: 27 %
Lymphs Abs: 4.2 10*3/uL — ABNORMAL HIGH (ref 0.7–4.0)
MCH: 30.7 pg (ref 26.0–34.0)
MCHC: 36 g/dL (ref 30.0–36.0)
MCV: 85.4 fL (ref 80.0–100.0)
Monocytes Absolute: 0.9 10*3/uL (ref 0.1–1.0)
Monocytes Relative: 6 %
Neutro Abs: 9.7 10*3/uL — ABNORMAL HIGH (ref 1.7–7.7)
Neutrophils Relative %: 63 %
Platelets: 312 10*3/uL (ref 150–400)
RBC: 4.1 MIL/uL (ref 3.87–5.11)
RDW: 13.5 % (ref 11.5–15.5)
WBC: 15.4 10*3/uL — ABNORMAL HIGH (ref 4.0–10.5)
nRBC: 0 % (ref 0.0–0.2)

## 2022-07-16 LAB — BASIC METABOLIC PANEL
Anion gap: 7 (ref 5–15)
BUN: 8 mg/dL (ref 6–20)
CO2: 25 mmol/L (ref 22–32)
Calcium: 8.8 mg/dL — ABNORMAL LOW (ref 8.9–10.3)
Chloride: 104 mmol/L (ref 98–111)
Creatinine, Ser: 0.5 mg/dL (ref 0.44–1.00)
GFR, Estimated: >60 mL/min (ref >60)
Glucose, Bld: 98 mg/dL (ref 70–99)
Potassium: 3.6 mmol/L (ref 3.5–5.1)
Sodium: 136 mmol/L (ref 135–145)

## 2022-07-17 LAB — IGG, IGA, IGM
IgA: 629 mg/dL — ABNORMAL HIGH (ref 87–352)
IgG (Immunoglobin G), Serum: 583 mg/dL — ABNORMAL LOW (ref 586–1602)
IgM (Immunoglobulin M), Srm: 25 mg/dL — ABNORMAL LOW (ref 26–217)

## 2022-07-19 ENCOUNTER — Encounter: Payer: Self-pay | Admitting: Nurse Practitioner

## 2022-07-19 ENCOUNTER — Inpatient Hospital Stay (HOSPITAL_BASED_OUTPATIENT_CLINIC_OR_DEPARTMENT_OTHER): Payer: Managed Care, Other (non HMO) | Admitting: Nurse Practitioner

## 2022-07-19 DIAGNOSIS — E876 Hypokalemia: Secondary | ICD-10-CM

## 2022-07-19 DIAGNOSIS — D72829 Elevated white blood cell count, unspecified: Secondary | ICD-10-CM

## 2022-07-19 DIAGNOSIS — D472 Monoclonal gammopathy: Secondary | ICD-10-CM | POA: Diagnosis not present

## 2022-07-19 LAB — KAPPA/LAMBDA LIGHT CHAINS
Kappa free light chain: 54.3 mg/L — ABNORMAL HIGH (ref 3.3–19.4)
Kappa, lambda light chain ratio: 5.54 — ABNORMAL HIGH (ref 0.26–1.65)
Lambda free light chains: 9.8 mg/L (ref 5.7–26.3)

## 2022-07-19 NOTE — Progress Notes (Signed)
[place]  Telephone:(336) [phone] Fax:(336) [phone]  ID: Allison Martinez OB: [birth year]  MR#: [id number]  YYT#:[id number]  Patient Care Team: [name], MD as PCP - General (Family Medicine)  I connected with Allison Martinez on 07/19/22 at  2:30 PM EDT by video enabled telemedicine visit and verified that I am speaking with the correct person using two identifiers.   I discussed the limitations, risks, security and privacy concerns of performing an evaluation and management service by telemedicine and the availability of in-person appointments. I also discussed with the patient that there may be a patient responsible charge related to this service. The patient expressed understanding and agreed to proceed.   Other persons participating in the visit and their role in the encounter: Patient & myself  Patient's location: Home. Provider's location: Clinic.  CHIEF COMPLAINT: MGUS, leukocytosis  INTERVAL HISTORY: Patient agrees to evaluation vis telemedicine for follow up for MGUS and leukocytosis. She continues to feel well and remains asymptomatic. No bone pain. She denies unintentional weight loss. No neurologic complaints. She denies any recent fevers or illnesses.  She has a good appetite and denies weight loss.  She denies any chest pain, shortness of breath, cough, or hemoptysis.  She denies any nausea, vomiting, constipation, or diarrhea. She has no urinary complaints.  Patient feels at her baseline offers no specific complaints today.  REVIEW OF SYSTEMS:   Review of Systems  Constitutional:  Negative for chills, diaphoresis, fever, malaise/fatigue and weight loss.  HENT:  Negative for congestion, ear discharge, ear pain, nosebleeds, sinus pain and sore throat.   Eyes:  Negative for pain and redness.  Respiratory:  Negative for cough, hemoptysis, sputum production, shortness of breath, wheezing and stridor.   Cardiovascular:  Negative for chest pain,  palpitations and leg swelling.  Gastrointestinal:  Negative for abdominal pain, constipation, diarrhea, nausea and vomiting.  Genitourinary:  Negative for dysuria and hematuria.  Musculoskeletal:  Negative for falls, joint pain and myalgias.  Skin:  Negative for rash.  Neurological:  Negative for dizziness, sensory change, loss of consciousness, weakness and headaches.  Psychiatric/Behavioral:  Negative for depression. The patient is not nervous/anxious and does not have insomnia.   All other systems reviewed and are negative. As per HPI. Otherwise, a complete review of systems is negative.  PAST MEDICAL HISTORY: Past Medical History:  Diagnosis Date   Achilles tendonitis    Anemia    Anxiety    Arthritis    back and neck   Asthma    Bandemia    Complication of anesthesia    hard time waking up after neck surgery and 2 breathing txs during surgery   Diverticulitis    Dysrhythmia    Endometriosis    Fibromyalgia 06/07/2017   GERD (gastroesophageal reflux disease)    H/O hiatal hernia    small   Headache(784.0)    Hypertension    Hypothyroidism    IBS (irritable bowel syndrome)    MGUS (monoclonal gammopathy of unknown significance)    SEEN AT [facility]ER [facility] EVERY 6 MONTHS   Migraine    complex   Mitral valve prolapse    Mitral valve prolapse    Murmur, cardiac    Pneumonia    Seizure (HCC)    ONLY AS A BABY   Seizure (HCC)    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[place])    PT DENIES RESIDUAL EFFECTS-THIS HAPPENED AT AGE 25 PER PT    PAST SURGICAL HISTORY: Past Surgical History:  Procedure  Laterality Date   ABDOMINAL HYSTERECTOMY     ANTERIOR CERVICAL DECOMP/DISCECTOMY FUSION     APPENDECTOMY     CHOLECYSTECTOMY     COLONOSCOPY N/A 09/12/2020   Procedure: COLONOSCOPY;  Surgeon: Lesly Rubenstein, MD;  Location: [facility] ENDOSCOPY;  Service: Endoscopy;  Laterality: N/A;   ESOPHAGOGASTRODUODENOSCOPY N/A 09/12/2020   Procedure: ESOPHAGOGASTRODUODENOSCOPY (EGD);  Surgeon: Lesly Rubenstein, MD;  Location: [facility] END[facility]COPY;  Service: Endoscopy;  Laterality: N/A;   LAPAROSCOPY  12/15/2012   Procedure: LAPAROSCOPY DIAGNOSTIC;  Surgeon: Lovenia Kim, MD;  Location: [facility] [facility];  Service: Gynecology;  Laterality: N/A;   MOUTH SURGERY     ROBOTIC ASSISTED TOTAL HYSTERECTOMY WITH BILATERAL SALPINGO OOPHERECTOMY Bilateral 02/25/2020   Procedure: XI ROBOTIC ASSISTED TOTAL HYSTERECTOMY WITH BILATERAL SALPINGECTOMY;  Surgeon: Ward, Honor Loh, MD;  Location: [facility];  Service: Gynecology;  Laterality: Bilateral;    FAMILY HISTORY Family History  Problem Relation Age of Onset   Arrhythmia Mother    Hypertension Mother    Heart disease Father        ADVANCED DIRECTIVES:    HEALTH MAINTENANCE: Social History   Tobacco Use   Smoking status: Every Day    Packs/day: 0.50    Years: 6.00    Total pack years: 3.00    Types: Cigarettes   Smokeless tobacco: Never  Vaping Use   Vaping Use: Never used  Substance Use Topics   Alcohol use: Yes    Comment: occasionally   Drug use: No     Colonoscopy:  PAP:  Bone density:  Lipid panel:  Allergies  Allergen Reactions   Fish-Derived Products Anaphylaxis   Shellfish Allergy Anaphylaxis   Sulfa Antibiotics Anaphylaxis   Tramadol Hcl Shortness Of Breath and Nausea Only    Passed out   Codeine Itching   Tylenol [Acetaminophen]     Cant take tylenol #3   Cefdinir Rash    Can take amoxicillin without problems   Tape Rash    PAPER TAPE OK TO USE PAPER TAPE OK TO USE    Current Outpatient Medications  Medication Sig Dispense Refill   acetaminophen (TYLENOL) 325 MG tablet Take 650 mg by mouth every 6 (six) hours as needed.     albuterol (VENTOLIN HFA) 108 (90 Base) MCG/ACT inhaler Inhale 1 puff into the lungs every morning. And PRN     budesonide-formoterol (SYMBICORT) 160-4.5 MCG/ACT inhaler Inhale into the lungs.     carvedilol (COREG) 6.25 MG tablet Take 6.25 mg by mouth 2 (two) times daily.     hydrochlorothiazide  (HYDRODIURIL) 25 MG tablet Take 25 mg by mouth daily.     levothyroxine (SYNTHROID, LEVOTHROID) 75 MCG tablet Take 75 mcg by mouth daily before breakfast.      losartan (COZAAR) 100 MG tablet Take 100 mg by mouth at bedtime.     omeprazole (PRILOSEC) 20 MG capsule Take 20 mg by mouth 2 (two) times daily before a meal.     vitamin B-12 (CYANOCOBALAMIN) 500 MCG tablet Take 500 mcg by mouth daily. (Patient not taking: Reported on 06/13/2020)     VITAMIN D, CHOLECALCIFEROL, PO Take 1 capsule by mouth daily. (Patient not taking: Reported on 06/13/2020)     No current facility-administered medications for this visit.    OBJECTIVE: There were no vitals filed for this visit.    There is no height or weight on file to calculate BMI.    ECOG FS:0 - Asymptomatic  General: Well-developed, well-nourished, no acute distress. HEENT: Normocephalic.  Neuro: Alert, answering all questions appropriately. Cranial nerves grossly intact. Psych: Normal affect.   LAB RESULTS:  Lab Results  Component Value Date   NA 136 07/16/2022   K 3.6 07/16/2022   CL 104 07/16/2022   CO2 25 07/16/2022   GLUCOSE 98 07/16/2022   BUN 8 07/16/2022   CREATININE 0.50 07/16/2022   CALCIUM 8.8 (L) 07/16/2022   PROT 6.9 06/07/2017   ALBUMIN 4.0 06/07/2017   AST 17 06/07/2017   ALT 19 06/07/2017   ALKPHOS 88 06/07/2017   BILITOT 0.2 06/07/2017   GFRNONAA >60 07/16/2022   GFRAA >60 06/09/2020    Lab Results  Component Value Date   WBC 15.4 (H) 07/16/2022   NEUTROABS 9.7 (H) 07/16/2022   HGB 12.6 07/16/2022   HCT 35.0 (L) 07/16/2022   MCV 85.4 07/16/2022   PLT 312 07/16/2022   Lab Results  Component Value Date   TOTALPROTELP 6.6 07/16/2022   ALBUMINELP 3.6 07/16/2022   A1GS 0.2 07/16/2022   A2GS 0.8 07/16/2022   BETS 1.4 (H) 07/16/2022   GAMS 0.6 07/16/2022   MSPIKE 0.6 (H) 07/16/2022   SPEI Comment 07/16/2022     STUDIES: No results found.  ASSESSMENT: MGUS, leukocytosis.  PLAN:    1. MGUS:  Previously M spike ranged from 0.3-0.7 then was no longer observed. Today, M spike is 0.6. Her IgA component remains persistently elevated between 600-700. Her kappa lambda light chain ratio has risen, now 5.54. Calcium is normal. No evidence of kidney dysfunction. No anemia. She remains asymptomatic of bone pain. Last bone scan was 2017 which was negative. Given rising [facility] ratio, plan to repeat bone survey. She does not require a bone marrow biopsy at this time. Plan to rtc in 6 months for repeat labs then she can follow up with Dr. Grayland Ormond a week to 2 weeks after for results.   2. Leukocytosis: Chronic and unchanged.  Patient's white blood cell count is persistently elevated, but approximately her baseline. Her white count has ranged from 12.2-21.3 since at least September 2001.  Today's result was 17.6.  Previously, BCR-ABL and peripheral blood flow cytometry were negative.  Repeat peripheral blood flow cytometry is negative as well.  No further intervention is needed.  No bone marrow biopsy necessary as above.  3.  Hypokalemia: K 3.6. Resolved.   4. Arthralgias and Myalgias- past 2 years. Slightly worse. Previous negative work up for RA.   I provided 20 minutes of face-to-face video visit time during this encounter which included chart review, counseling, and coordination of care as documented above.  Patient expressed understanding and was in agreement with this plan. She also understands that She can call clinic at any time with any questions, concerns, or complaints.   Verlon Au, NP   07/19/2022

## 2022-07-20 LAB — PROTEIN ELECTROPHORESIS, SERUM
A/G Ratio: 1.2 (ref 0.7–1.7)
Albumin ELP: 3.6 g/dL (ref 2.9–4.4)
Alpha-1-Globulin: 0.2 g/dL (ref 0.0–0.4)
Alpha-2-Globulin: 0.8 g/dL (ref 0.4–1.0)
Beta Globulin: 1.4 g/dL — ABNORMAL HIGH (ref 0.7–1.3)
Gamma Globulin: 0.6 g/dL (ref 0.4–1.8)
Globulin, Total: 3 g/dL (ref 2.2–3.9)
M-Spike, %: 0.6 g/dL — ABNORMAL HIGH
Total Protein ELP: 6.6 g/dL (ref 6.0–8.5)

## 2022-07-21 ENCOUNTER — Ambulatory Visit
Admission: RE | Admit: 2022-07-21 | Discharge: 2022-07-21 | Disposition: A | Discharge status: Home / Self Care | Payer: Managed Care, Other (non HMO) | Attending: Nurse Practitioner | Admitting: Nurse Practitioner

## 2022-07-21 ENCOUNTER — Ambulatory Visit
Admission: RE | Admit: 2022-07-21 | Discharge: 2022-07-21 | Disposition: A | Discharge status: Home / Self Care | Payer: Managed Care, Other (non HMO) | Source: Ambulatory Visit | Attending: Nurse Practitioner

## 2022-07-21 DIAGNOSIS — D472 Monoclonal gammopathy: Secondary | ICD-10-CM | POA: Diagnosis not present

## 2022-07-22 ENCOUNTER — Encounter: Payer: Self-pay | Admitting: Nurse Practitioner

## 2023-01-11 ENCOUNTER — Other Ambulatory Visit: Payer: Self-pay | Admitting: *Deleted

## 2023-01-11 DIAGNOSIS — D472 Monoclonal gammopathy: Secondary | ICD-10-CM

## 2023-01-12 ENCOUNTER — Inpatient Hospital Stay: Payer: Managed Care, Other (non HMO) | Attending: Oncology

## 2023-01-12 DIAGNOSIS — D72829 Elevated white blood cell count, unspecified: Secondary | ICD-10-CM | POA: Diagnosis not present

## 2023-01-12 DIAGNOSIS — D472 Monoclonal gammopathy: Secondary | ICD-10-CM | POA: Diagnosis present

## 2023-01-12 LAB — CBC WITH DIFFERENTIAL/PLATELET
Abs Immature Granulocytes: 0.05 10*3/uL (ref 0.00–0.07)
Basophils Absolute: 0.1 10*3/uL (ref 0.0–0.1)
Basophils Relative: 0 %
Eosinophils Absolute: 0.6 10*3/uL — ABNORMAL HIGH (ref 0.0–0.5)
Eosinophils Relative: 4 %
HCT: 39.6 % (ref 36.0–46.0)
Hemoglobin: 13.9 g/dL (ref 12.0–15.0)
Immature Granulocytes: 0 %
Lymphocytes Relative: 33 %
Lymphs Abs: 4.8 10*3/uL — ABNORMAL HIGH (ref 0.7–4.0)
MCH: 30.5 pg (ref 26.0–34.0)
MCHC: 35.1 g/dL (ref 30.0–36.0)
MCV: 86.8 fL (ref 80.0–100.0)
Monocytes Absolute: 0.8 10*3/uL (ref 0.1–1.0)
Monocytes Relative: 6 %
Neutro Abs: 8.1 10*3/uL — ABNORMAL HIGH (ref 1.7–7.7)
Neutrophils Relative %: 57 %
Platelets: 331 10*3/uL (ref 150–400)
RBC: 4.56 MIL/uL (ref 3.87–5.11)
RDW: 13.6 % (ref 11.5–15.5)
WBC: 14.4 10*3/uL — ABNORMAL HIGH (ref 4.0–10.5)
nRBC: 0 % (ref 0.0–0.2)

## 2023-01-12 LAB — BASIC METABOLIC PANEL WITH GFR
Anion gap: 12 (ref 5–15)
BUN: 12 mg/dL (ref 6–20)
CO2: 25 mmol/L (ref 22–32)
Calcium: 9.4 mg/dL (ref 8.9–10.3)
Chloride: 101 mmol/L (ref 98–111)
Creatinine, Ser: 0.63 mg/dL (ref 0.44–1.00)
GFR, Estimated: >60 mL/min
Glucose, Bld: 97 mg/dL (ref 70–99)
Potassium: 3.6 mmol/L (ref 3.5–5.1)
Sodium: 138 mmol/L (ref 135–145)

## 2023-01-14 ENCOUNTER — Encounter: Payer: Self-pay | Admitting: Oncology

## 2023-01-14 LAB — PROTEIN ELECTROPHORESIS, SERUM
A/G Ratio: 1.4 (ref 0.7–1.7)
Albumin ELP: 4.1 g/dL (ref 2.9–4.4)
Alpha-1-Globulin: 0.2 g/dL (ref 0.0–0.4)
Alpha-2-Globulin: 0.8 g/dL (ref 0.4–1.0)
Beta Globulin: 1.3 g/dL (ref 0.7–1.3)
Gamma Globulin: 0.7 g/dL (ref 0.4–1.8)
Globulin, Total: 3 g/dL (ref 2.2–3.9)
M-Spike, %: 0.5 g/dL — ABNORMAL HIGH
Total Protein ELP: 7.1 g/dL (ref 6.0–8.5)

## 2023-01-14 LAB — KAPPA/LAMBDA LIGHT CHAINS
Kappa free light chain: 58.8 mg/L — ABNORMAL HIGH (ref 3.3–19.4)
Kappa, lambda light chain ratio: 5.6 — ABNORMAL HIGH (ref 0.26–1.65)
Lambda free light chains: 10.5 mg/L (ref 5.7–26.3)

## 2023-01-14 LAB — IGG, IGA, IGM
IgA: 727 mg/dL — ABNORMAL HIGH (ref 87–352)
IgG (Immunoglobin G), Serum: 686 mg/dL (ref 586–1602)
IgM (Immunoglobulin M), Srm: 30 mg/dL (ref 26–217)

## 2023-01-18 ENCOUNTER — Inpatient Hospital Stay (HOSPITAL_BASED_OUTPATIENT_CLINIC_OR_DEPARTMENT_OTHER): Payer: Managed Care, Other (non HMO) | Admitting: Oncology

## 2023-01-18 ENCOUNTER — Encounter: Payer: Self-pay | Admitting: Oncology

## 2023-01-18 DIAGNOSIS — D472 Monoclonal gammopathy: Secondary | ICD-10-CM

## 2023-01-18 DIAGNOSIS — D72829 Elevated white blood cell count, unspecified: Secondary | ICD-10-CM | POA: Diagnosis not present

## 2023-01-18 NOTE — Progress Notes (Signed)
[facility]  Telephone:(336) [phone] Fax:(336) [phone]  ID: Allison Martinez OB: [birth year]  MR#: [id number]  WNU#:[id number]  Patient Care Team: Marisue Ivan, MD as PCP - General (Family Medicine) Jeralyn Ruths, MD as Consulting Physician (Oncology)  I connected with Allison Martinez on 01/19/23 at  2:15 PM EST by video enabled telemedicine visit and verified that I am speaking with the correct person using two identifiers.   I discussed the limitations, risks, security and privacy concerns of performing an evaluation and management service by telemedicine and the availability of in-person appointments. I also discussed with the patient that there may be a patient responsible charge related to this service. The patient expressed understanding and agreed to proceed.   Other persons participating in the visit and their role in the encounter: Patient, MD.  Patient's location: Home. Provider's location: Clinic.   CHIEF COMPLAINT: MGUS, leukocytosis.  INTERVAL HISTORY: Patient agreed to video assisted telemedicine visit for routine yearly evaluation and discussion of her laboratory results.  She continues to feel well and remains asymptomatic. She has no neurologic complaints. She denies any recent fevers or illnesses.  She has a good appetite and denies weight loss.  She denies any chest pain, shortness of breath, cough, or hemoptysis.  She denies any nausea, vomiting, constipation, or diarrhea. She has no urinary complaints.  Patient offers no specific complaints today.  REVIEW OF SYSTEMS:   Review of Systems  Constitutional: Negative.  Negative for fever, malaise/fatigue and weight loss.  Respiratory: Negative.  Negative for cough and sputum production.   Cardiovascular: Negative.  Negative for chest pain and leg swelling.  Gastrointestinal: Negative.  Negative for abdominal pain.  Genitourinary: Negative.   Musculoskeletal: Negative.  Negative for back pain,  joint pain and neck pain.  Skin: Negative.  Negative for rash.  Neurological: Negative.  Negative for sensory change, focal weakness and weakness.  Psychiatric/Behavioral: Negative.  The patient is not nervous/anxious.     As per HPI. Otherwise, a complete review of systems is negative.  PAST MEDICAL HISTORY: Past Medical History:  Diagnosis Date   Achilles tendonitis    Anemia    Anxiety    Arthritis    back and neck   Asthma    Bandemia    Complication of anesthesia    hard time waking up after neck surgery and 2 breathing txs during surgery   Diverticulitis    Dysrhythmia    Endometriosis    Fibromyalgia 06/07/2017   GERD (gastroesophageal reflux disease)    H/O hiatal hernia    small   Headache(784.0)    Hypertension    Hypothyroidism    IBS (irritable bowel syndrome)    MGUS (monoclonal gammopathy of unknown significance)    SEEN AT CANCER CENTER EVERY 6 MONTHS   Migraine    complex   Mitral valve prolapse    Mitral valve prolapse    Murmur, cardiac    Pneumonia    Seizure (HCC)    ONLY AS A BABY   Seizure (HCC)    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HCC)    PT DENIES RESIDUAL EFFECTS-THIS HAPPENED AT AGE 75 PER PT    PAST SURGICAL HISTORY: Past Surgical History:  Procedure Laterality Date   ABDOMINAL HYSTERECTOMY     ANTERIOR CERVICAL DECOMP/DISCECTOMY FUSION     APPENDECTOMY     CHOLECYSTECTOMY     COLONOSCOPY N/A 09/12/2020   Procedure: COLONOSCOPY;  Surgeon: Regis Bill, MD;  Location: [facility] ENDOSCOPY;  Service: Endoscopy;  Laterality: N/A;   ESOPHAGOGASTRODUODENOSCOPY N/A 09/12/2020   Procedure: ESOPHAGOGASTRODUODENOSCOPY (EGD);  Surgeon: Regis Bill, MD;  Location: [facility] ENDOSCOPY;  Service: Endoscopy;  Laterality: N/A;   LAPAROSCOPY  12/15/2012   Procedure: LAPAROSCOPY DIAGNOSTIC;  Surgeon: Lenoard Aden, MD;  Location: [facility];  Service: Gynecology;  Laterality: N/A;   MOUTH SURGERY     ROBOTIC ASSISTED TOTAL HYSTERECTOMY WITH BILATERAL SALPINGO  OOPHERECTOMY Bilateral 02/25/2020   Procedure: XI ROBOTIC ASSISTED TOTAL HYSTERECTOMY WITH BILATERAL SALPINGECTOMY;  Surgeon: Ward, Elenora Fender, MD;  Location: [facility];  Service: Gynecology;  Laterality: Bilateral;    FAMILY HISTORY Family History  Problem Relation Age of Onset   Arrhythmia Mother    Hypertension Mother    Heart disease Father        ADVANCED DIRECTIVES:    HEALTH MAINTENANCE: Social History   Tobacco Use   Smoking status: Every Day    Packs/day: 0.50    Years: 6.00    Total pack years: 3.00    Types: Cigarettes   Smokeless tobacco: Never  Vaping Use   Vaping Use: Never used  Substance Use Topics   Alcohol use: Yes    Comment: occasionally   Drug use: No     Colonoscopy:  PAP:  Bone density:  Lipid panel:  Allergies  Allergen Reactions   Fish-Derived Products Anaphylaxis   Shellfish Allergy Anaphylaxis   Sulfa Antibiotics Anaphylaxis   Tramadol Hcl Shortness Of Breath and Nausea Only    Passed out   Codeine Itching   Tylenol [Acetaminophen]     Cant take tylenol #3   Cefdinir Rash    Can take amoxicillin without problems   Tape Rash    PAPER TAPE OK TO USE PAPER TAPE OK TO USE    Current Outpatient Medications  Medication Sig Dispense Refill   acetaminophen (TYLENOL) 325 MG tablet Take 650 mg by mouth every 6 (six) hours as needed.     albuterol (VENTOLIN HFA) 108 (90 Base) MCG/ACT inhaler Inhale 1 puff into the lungs every morning. And PRN     budesonide-formoterol (SYMBICORT) 160-4.5 MCG/ACT inhaler Inhale into the lungs.     carvedilol (COREG) 6.25 MG tablet Take 6.25 mg by mouth 2 (two) times daily.     hydrochlorothiazide (HYDRODIURIL) 25 MG tablet Take 25 mg by mouth daily.     levothyroxine (SYNTHROID, LEVOTHROID) 75 MCG tablet Take 75 mcg by mouth daily before breakfast.      losartan (COZAAR) 100 MG tablet Take 100 mg by mouth at bedtime.     omeprazole (PRILOSEC) 20 MG capsule Take 20 mg by mouth 2 (two) times daily before a  meal.     VITAMIN D, CHOLECALCIFEROL, PO Take 1 capsule by mouth daily.     No current facility-administered medications for this visit.    OBJECTIVE: There were no vitals filed for this visit.    There is no height or weight on file to calculate BMI.    ECOG FS:0 - Asymptomatic  General: Well-developed, well-nourished, no acute distress. HEENT: Normocephalic. Neuro: Alert, answering all questions appropriately. Cranial nerves grossly intact. Psych: Normal affect.  LAB RESULTS:  Lab Results  Component Value Date   NA 138 01/12/2023   K 3.6 01/12/2023   CL 101 01/12/2023   CO2 25 01/12/2023   GLUCOSE 97 01/12/2023   BUN 12 01/12/2023   CREATININE 0.63 01/12/2023   CALCIUM 9.4 01/12/2023   PROT 6.9 06/07/2017   ALBUMIN 4.0 06/07/2017  AST 17 06/07/2017   ALT 19 06/07/2017   ALKPHOS 88 06/07/2017   BILITOT 0.2 06/07/2017   GFRNONAA >60 01/12/2023   GFRAA >60 06/09/2020    Lab Results  Component Value Date   WBC 14.4 (H) 01/12/2023   NEUTROABS 8.1 (H) 01/12/2023   HGB 13.9 01/12/2023   HCT 39.6 01/12/2023   MCV 86.8 01/12/2023   PLT 331 01/12/2023   Lab Results  Component Value Date   TOTALPROTELP 7.1 01/12/2023   ALBUMINELP 4.1 01/12/2023   A1GS 0.2 01/12/2023   A2GS 0.8 01/12/2023   BETS 1.3 01/12/2023   GAMS 0.7 01/12/2023   MSPIKE 0.5 (H) 01/12/2023   SPEI Comment 01/12/2023     STUDIES: No results found.  ASSESSMENT: MGUS, leukocytosis.  PLAN:    1. MGUS: Patient's M spike has ranged from not observed to 0.7 since at least February 2017.  Her most recent result is 0.5.  Her IgA levels have ranged from 547-949 over the same timeframe.  Today's results are 727.  Finally, her kappa free light chains have ranged from 35.6-58.8.  Today's result 58.8.  Metastatic bone survey completed on February 17, 2016 reviewed independently with no obvious lesions.  She has no evidence of endorgan damage.  No intervention is needed at this time.  Patient does not  require bone marrow biopsy.  Return to clinic in 1 year with repeat laboratory work and video assisted telemedicine visit.  2. Leukocytosis: Chronic and unchanged.  Patient's white blood cell count has been chronically elevated since April 2009.  Today's result of 14.4 is approximately her baseline.  Previously, BCR-ABL and peripheral blood flow cytometry were negative.  Repeat peripheral blood flow cytometry is negative as well.  No further intervention is needed.  No bone marrow biopsy necessary as above.  I provided 20 minutes of face-to-face video visit time during this encounter which included chart review, counseling, and coordination of care as documented above.    Patient expressed understanding and was in agreement with this plan. She also understands that She can call clinic at any time with any questions, concerns, or complaints.   Jeralyn Ruths, MD   01/19/2023 6:27 AM

## 2023-01-18 NOTE — Progress Notes (Unsigned)
Patient denies any concerns today.  

## 2023-02-28 ENCOUNTER — Emergency Department: Payer: Managed Care, Other (non HMO)

## 2023-02-28 ENCOUNTER — Encounter: Payer: Self-pay | Admitting: Emergency Medicine

## 2023-02-28 ENCOUNTER — Other Ambulatory Visit: Payer: Self-pay

## 2023-02-28 ENCOUNTER — Emergency Department
Admission: EM | Admit: 2023-02-28 | Discharge: 2023-02-28 | Disposition: A | Discharge status: Home / Self Care | Payer: Managed Care, Other (non HMO) | Attending: Student in an Organized Health Care Education/Training Program | Admitting: Student in an Organized Health Care Education/Training Program

## 2023-02-28 DIAGNOSIS — F17200 Nicotine dependence, unspecified, uncomplicated: Secondary | ICD-10-CM | POA: Diagnosis not present

## 2023-02-28 DIAGNOSIS — R0789 Other chest pain: Secondary | ICD-10-CM

## 2023-02-28 DIAGNOSIS — Z7951 Long term (current) use of inhaled steroids: Secondary | ICD-10-CM | POA: Insufficient documentation

## 2023-02-28 DIAGNOSIS — J45909 Unspecified asthma, uncomplicated: Secondary | ICD-10-CM | POA: Insufficient documentation

## 2023-02-28 LAB — CBC WITH DIFFERENTIAL/PLATELET
Abs Immature Granulocytes: 0.07 10*3/uL (ref 0.00–0.07)
Basophils Absolute: 0 10*3/uL (ref 0.0–0.1)
Basophils Relative: 0 %
Eosinophils Absolute: 0.4 10*3/uL (ref 0.0–0.5)
Eosinophils Relative: 3 %
HCT: 34.9 % — ABNORMAL LOW (ref 36.0–46.0)
Hemoglobin: 12 g/dL (ref 12.0–15.0)
Immature Granulocytes: 1 %
Lymphocytes Relative: 23 %
Lymphs Abs: 3.3 10*3/uL (ref 0.7–4.0)
MCH: 29.8 pg (ref 26.0–34.0)
MCHC: 34.4 g/dL (ref 30.0–36.0)
MCV: 86.6 fL (ref 80.0–100.0)
Monocytes Absolute: 0.7 10*3/uL (ref 0.1–1.0)
Monocytes Relative: 5 %
Neutro Abs: 9.7 10*3/uL — ABNORMAL HIGH (ref 1.7–7.7)
Neutrophils Relative %: 68 %
Platelets: 300 10*3/uL (ref 150–400)
RBC: 4.03 MIL/uL (ref 3.87–5.11)
RDW: 14.1 % (ref 11.5–15.5)
WBC: 14.3 10*3/uL — ABNORMAL HIGH (ref 4.0–10.5)
nRBC: 0 % (ref 0.0–0.2)

## 2023-02-28 LAB — BASIC METABOLIC PANEL
Anion gap: 9 (ref 5–15)
BUN: 12 mg/dL (ref 6–20)
CO2: 23 mmol/L (ref 22–32)
Calcium: 9.1 mg/dL (ref 8.9–10.3)
Chloride: 105 mmol/L (ref 98–111)
Creatinine, Ser: 0.62 mg/dL (ref 0.44–1.00)
GFR, Estimated: >60 mL/min (ref >60)
Glucose, Bld: 111 mg/dL — ABNORMAL HIGH (ref 70–99)
Potassium: 3.5 mmol/L (ref 3.5–5.1)
Sodium: 137 mmol/L (ref 135–145)

## 2023-02-28 LAB — TROPONIN I (HIGH SENSITIVITY)
Troponin I (High Sensitivity): 2 ng/L (ref <18)
Troponin I (High Sensitivity): 2 ng/L (ref <18)

## 2023-02-28 LAB — D-DIMER, QUANTITATIVE: D-Dimer, Quant: 0.28 ug/mL-FEU (ref 0.00–0.50)

## 2023-02-28 MED ORDER — OXYCODONE-ACETAMINOPHEN 5-325 MG PO TABS
1.0000 | ORAL_TABLET | Freq: Once | ORAL | Status: AC
  Administered 2023-02-28: 1 via ORAL
  Filled 2023-02-28: qty 1

## 2023-02-28 MED ORDER — LIDOCAINE 5 % EX PTCH: 1.0000 | MEDICATED_PATCH | Freq: Two times a day (BID) | CUTANEOUS | 0 refills | Status: AC

## 2023-02-28 MED ORDER — IOHEXOL 350 MG/ML SOLN
75.0000 mL | Freq: Once | INTRAVENOUS | Status: AC | PRN
  Administered 2023-02-28: 75 mL via INTRAVENOUS

## 2023-02-28 MED ORDER — CYCLOBENZAPRINE HCL 10 MG PO TABS
5.0000 mg | ORAL_TABLET | Freq: Once | ORAL | Status: AC
  Administered 2023-02-28: 5 mg via ORAL
  Filled 2023-02-28: qty 1

## 2023-02-28 MED ORDER — LIDOCAINE 5 % EX PTCH
1.0000 | MEDICATED_PATCH | CUTANEOUS | Status: DC
  Administered 2023-02-28: 1 via TRANSDERMAL
  Filled 2023-02-28: qty 1

## 2023-02-28 MED ORDER — IPRATROPIUM-ALBUTEROL 0.5-2.5 (3) MG/3ML IN SOLN
3.0000 mL | Freq: Once | RESPIRATORY_TRACT | Status: AC
  Administered 2023-02-28: 3 mL via RESPIRATORY_TRACT
  Filled 2023-02-28: qty 3

## 2023-02-28 MED ORDER — OXYCODONE-ACETAMINOPHEN 5-325 MG PO TABS: 1.0000 | ORAL_TABLET | ORAL | 0 refills | Status: AC | PRN

## 2023-02-28 NOTE — ED Triage Notes (Addendum)
Pt here with left side rib pain. Pt states she rolled over in bed this morning and felt something "pop" on her side. Pt denies fall. Pt denies nausea, vomiting, and pain. Pt states pain with movement and ambulation.

## 2023-02-28 NOTE — ED Provider Notes (Addendum)
[facility] Provider Note    Event Date/Time   First MD Initiated Contact with Patient 02/28/23 [phone]     (approximate)   History   Rib Injury   HPI  Allison Martinez is a 47 y.o. female with a history of fibromyalgia presents to the ER for evaluation of left anterior chest wall pain that occurred after she was rolling over in bed this morning and felt a popping sensation.  States the pain is persistent.  Does have some pain with inspiration with movement and twisting.  Denies any abdominal pain.  Has never had pain like this before.  No history of DVT or PE.  Does have a history of asthma and recently quit smoking and is has been having more frequent coughing as of late also had COVID a few weeks ago.  No lower extremity swelling.  Denies any fevers.     Physical Exam   Triage Vital Signs: ED Triage Vitals  Enc Vitals Group     BP 02/28/23 0710 (!) 156/88     Pulse Rate 02/28/23 0709 86     Resp 02/28/23 0709 16     Temp 02/28/23 0709 98.1 F (36.7 C)     Temp Source 02/28/23 0709 Oral     SpO2 02/28/23 0709 98 %     Weight 02/28/23 0709 259 lb 14.8 oz (117.9 kg)     Height 02/28/23 0709 5' (1.524 m)     Head Circumference --      Peak Flow --      Pain Score 02/28/23 0709 9     Pain Loc --      Pain Edu? --      Excl. in [place]? --     Most recent vital signs: Vitals:   02/28/23 0710 02/28/23 1111  BP: (!) 156/88 (!) 150/80  Pulse:  80  Resp:  16  Temp:  98 F (36.7 C)  SpO2:       Constitutional: Alert  Eyes: Conjunctivae are normal.  Head: Atraumatic. Nose: No congestion/rhinnorhea. Mouth/Throat: Mucous membranes are moist.   Neck: Painless ROM.  Cardiovascular:   Good peripheral circulation. Respiratory: Normal respiratory effort.  No retractions.  Gastrointestinal: Soft and nontender.  Musculoskeletal:  no deformity, pain is reproducible palpation just under the left breast.  No crepitus. Neurologic:  MAE spontaneously. No gross  focal neurologic deficits are appreciated.  Skin:  Skin is warm, dry and intact. No rash noted. Psychiatric: Mood and affect are normal. Speech and behavior are normal.    ED Results / Procedures / Treatments   Labs (all labs ordered are listed, but only abnormal results are displayed) Labs Reviewed  CBC WITH DIFFERENTIAL/PLATELET - Abnormal; Notable for the following components:      Result Value   WBC 14.3 (*)    HCT 34.9 (*)    Neutro Abs 9.7 (*)    All other components within normal limits  BASIC METABOLIC PANEL - Abnormal; Notable for the following components:   Glucose, Bld 111 (*)    All other components within normal limits  D-DIMER, QUANTITATIVE  TROPONIN I (HIGH SENSITIVITY)  TROPONIN I (HIGH SENSITIVITY)     EKG  ED ECG REPORT I, Merlyn Lot, the attending physician, personally viewed and interpreted this ECG.   Date: 02/28/2023  EKG Time: 7:25  Rate: 75  Rhythm: sinus  Axis: normal  Intervals: normal  ST&T Change: no stemi, no depressions    RADIOLOGY Please see  ED Course for my review and interpretation.  I personally reviewed all radiographic images ordered to evaluate for the above acute complaints and reviewed radiology reports and findings.  These findings were personally discussed with the patient.  Please see medical record for radiology report.    PROCEDURES:  Critical Care performed: No  Procedures   MEDICATIONS ORDERED IN ED: Medications  lidocaine (LIDODERM) 5 % 1 patch (1 patch Transdermal Patch Applied 02/28/23 0804)  oxyCODONE-acetaminophen (PERCOCET/ROXICET) 5-325 MG per tablet 1 tablet (1 tablet Oral Given 02/28/23 0804)  ipratropium-albuterol (DUONEB) 0.5-2.5 (3) MG/3ML nebulizer solution 3 mL (3 mLs Nebulization Given 02/28/23 0910)  cyclobenzaprine (FLEXERIL) tablet 5 mg (5 mg Oral Given 02/28/23 0938)  oxyCODONE-acetaminophen (PERCOCET/ROXICET) 5-325 MG per tablet 1 tablet (1 tablet Oral Given 02/28/23 1019)  iohexol  (OMNIPAQUE) 350 MG/ML injection 75 mL (75 mLs Intravenous Contrast Given 02/28/23 1031)     IMPRESSION / MDM / ASSESSMENT AND PLAN / ED COURSE  I reviewed the triage vital signs and the nursing notes.                              Differential diagnosis includes, but is not limited to, fracture, contusion, ACS, pericarditis, esophagitis, pe, dissection, pna, bronchitis, costochondritis  Patient presenting to the ER for evaluation of symptoms as described above.  Based on symptoms, risk factors and considered above differential, this presenting complaint could reflect a potentially life-threatening illness therefore the patient will be placed on continuous pulse oximetry and telemetry for monitoring.  Laboratory evaluation will be sent to evaluate for the above complaints.  Will give pain medication as well as Lidoderm patch.  I think is most likely musculoskeletal related pain.  EKG is nonischemic will check troponin.  She is low risk by Wells criteria she is PERC negative.  Chest x-ray ordered.    Clinical Course as of 02/28/23 1143  Mon Feb 28, 2023  0754 Chest x-ray my review and interpretation without evidence of pneumothorax or consolidation. [PR]    Clinical Course User Index [PR] Merlyn Lot, MD   CT without evidence of fracture or pneumothorax pneumonia or PE.  Pain improved.  Repeat troponin negative.  At this point most consistent musculoskeletal strain does appear stable and appropriate for outpatient follow-up.   FINAL CLINICAL IMPRESSION(S) / ED DIAGNOSES   Final diagnoses:  Chest wall pain     Rx / DC Orders   ED Discharge Orders          Ordered    oxyCODONE-acetaminophen (PERCOCET) 5-325 MG tablet  Every 4 hours PRN        02/28/23 1142    lidocaine (LIDODERM) 5 %  Every 12 hours        02/28/23 1142             Note:  This document was prepared using Dragon voice recognition software and may include unintentional dictation errors.    Merlyn Lot, MD 02/28/23 1143    Merlyn Lot, MD 02/28/23 1143

## 2023-02-28 NOTE — ED Notes (Signed)
See triage note  Presents with pain to left lateral rib area  States she rolled over this am  Felt a "pop"  having pain with movement and inspiration   Denies any fall/trauma   States she has had cough

## 2023-11-04 ENCOUNTER — Other Ambulatory Visit: Payer: Self-pay | Admitting: Rheumatology

## 2023-11-04 DIAGNOSIS — M2550 Pain in unspecified joint: Secondary | ICD-10-CM

## 2023-11-21 ENCOUNTER — Encounter: Payer: Self-pay | Admitting: Oncology

## 2023-11-25 ENCOUNTER — Other Ambulatory Visit: Payer: Self-pay | Admitting: Rheumatology

## 2023-11-25 DIAGNOSIS — M2550 Pain in unspecified joint: Secondary | ICD-10-CM

## 2023-12-01 ENCOUNTER — Encounter
Admission: RE | Admit: 2023-12-01 | Discharge: 2023-12-01 | Disposition: A | Discharge status: Home / Self Care | Payer: Managed Care, Other (non HMO) | Source: Ambulatory Visit | Attending: Rheumatology | Admitting: Rheumatology

## 2023-12-01 DIAGNOSIS — M2550 Pain in unspecified joint: Secondary | ICD-10-CM | POA: Diagnosis present

## 2023-12-01 MED ORDER — TECHNETIUM TC 99M MEDRONATE IV KIT
20.0000 | PACK | Freq: Once | INTRAVENOUS | Status: AC | PRN
  Administered 2023-12-01: 22.08 via INTRAVENOUS

## 2024-01-19 ENCOUNTER — Inpatient Hospital Stay: Payer: Managed Care, Other (non HMO) | Attending: Oncology

## 2024-01-19 DIAGNOSIS — D472 Monoclonal gammopathy: Secondary | ICD-10-CM | POA: Insufficient documentation

## 2024-01-19 LAB — CBC WITH DIFFERENTIAL/PLATELET
Abs Immature Granulocytes: 0.08 10*3/uL — ABNORMAL HIGH (ref 0.00–0.07)
Basophils Absolute: 0.1 10*3/uL (ref 0.0–0.1)
Basophils Relative: 0 %
Eosinophils Absolute: 0.5 10*3/uL (ref 0.0–0.5)
Eosinophils Relative: 3 %
HCT: 38.1 % (ref 36.0–46.0)
Hemoglobin: 13.4 g/dL (ref 12.0–15.0)
Immature Granulocytes: 1 %
Lymphocytes Relative: 32 %
Lymphs Abs: 4.8 10*3/uL — ABNORMAL HIGH (ref 0.7–4.0)
MCH: 29.3 pg (ref 26.0–34.0)
MCHC: 35.2 g/dL (ref 30.0–36.0)
MCV: 83.4 fL (ref 80.0–100.0)
Monocytes Absolute: 0.6 10*3/uL (ref 0.1–1.0)
Monocytes Relative: 4 %
Neutro Abs: 9.1 10*3/uL — ABNORMAL HIGH (ref 1.7–7.7)
Neutrophils Relative %: 60 %
Platelets: 320 10*3/uL (ref 150–400)
RBC: 4.57 MIL/uL (ref 3.87–5.11)
RDW: 14.3 % (ref 11.5–15.5)
WBC: 15.1 10*3/uL — ABNORMAL HIGH (ref 4.0–10.5)
nRBC: 0 % (ref 0.0–0.2)

## 2024-01-19 LAB — BASIC METABOLIC PANEL
Anion gap: 11 (ref 5–15)
BUN: 13 mg/dL (ref 6–20)
CO2: 24 mmol/L (ref 22–32)
Calcium: 9.1 mg/dL (ref 8.9–10.3)
Chloride: 101 mmol/L (ref 98–111)
Creatinine, Ser: 0.59 mg/dL (ref 0.44–1.00)
GFR, Estimated: >60 mL/min (ref >60)
Glucose, Bld: 114 mg/dL — ABNORMAL HIGH (ref 70–99)
Potassium: 3.3 mmol/L — ABNORMAL LOW (ref 3.5–5.1)
Sodium: 136 mmol/L (ref 135–145)

## 2024-01-20 LAB — IGG, IGA, IGM
IgA: 685 mg/dL — ABNORMAL HIGH (ref 87–352)
IgG (Immunoglobin G), Serum: 710 mg/dL (ref 586–1602)
IgM (Immunoglobulin M), Srm: 34 mg/dL (ref 26–217)

## 2024-01-23 LAB — KAPPA/LAMBDA LIGHT CHAINS
Kappa free light chain: 46.1 mg/L — ABNORMAL HIGH (ref 3.3–19.4)
Kappa, lambda light chain ratio: 4.04 — ABNORMAL HIGH (ref 0.26–1.65)
Lambda free light chains: 11.4 mg/L (ref 5.7–26.3)

## 2024-01-25 LAB — PROTEIN ELECTROPHORESIS, SERUM
A/G Ratio: 1.1 (ref 0.7–1.7)
Albumin ELP: 3.6 g/dL (ref 2.9–4.4)
Alpha-1-Globulin: 0.3 g/dL (ref 0.0–0.4)
Alpha-2-Globulin: 0.9 g/dL (ref 0.4–1.0)
Beta Globulin: 1.5 g/dL — ABNORMAL HIGH (ref 0.7–1.3)
Gamma Globulin: 0.7 g/dL (ref 0.4–1.8)
Globulin, Total: 3.4 g/dL (ref 2.2–3.9)
M-Spike, %: 0.7 g/dL — ABNORMAL HIGH
Total Protein ELP: 7 g/dL (ref 6.0–8.5)

## 2024-01-26 ENCOUNTER — Inpatient Hospital Stay: Payer: Managed Care, Other (non HMO) | Attending: Oncology | Admitting: Oncology

## 2024-01-26 DIAGNOSIS — D472 Monoclonal gammopathy: Secondary | ICD-10-CM | POA: Diagnosis not present

## 2024-01-26 NOTE — Progress Notes (Addendum)
 [facility]  Telephone:(336) [phone] Fax:(336) [phone]  ID: Allison Martinez OB: November 26, 1976  MR#: [id number]  RDW#:[id number]  Patient Care Team: Alla Amis, MD as PCP - General (Family Medicine) Jacobo Evalene PARAS, MD as Consulting Physician (Oncology)  I connected with Allison Martinez on 01/26/24 at  2:45 PM EST by video enabled telemedicine visit and verified that I am speaking with the correct person using two identifiers.   I discussed the limitations, risks, security and privacy concerns of performing an evaluation and management service by telemedicine and the availability of in-person appointments. I also discussed with the patient that there may be a patient responsible charge related to this service. The patient expressed understanding and agreed to proceed.   Other persons participating in the visit and their role in the encounter: Patient, MD.  Patient's location: Home. Provider's location: Clinic.  CHIEF COMPLAINT: MGUS, leukocytosis.  INTERVAL HISTORY: Patient agreed to video assisted telemedicine visit for routine yearly evaluation and discussion of her laboratory results.  She continues to have problems with pain and fatigue and is being followed by rheumatology and also complains of loss of hearing which is being evaluated by ENT.  She otherwise feels well.  She has no neurologic complaints. She denies any recent fevers or illnesses.  She has a good appetite and denies weight loss.  She denies any chest pain, shortness of breath, cough, or hemoptysis.  She denies any nausea, vomiting, constipation, or diarrhea. She has no urinary complaints.  Patient offers no further specific complaints today.  REVIEW OF SYSTEMS:   Review of Systems  Constitutional:  Positive for malaise/fatigue. Negative for fever and weight loss.  HENT:  Positive for hearing loss.   Respiratory: Negative.  Negative for cough and sputum production.   Cardiovascular: Negative.   Negative for chest pain and leg swelling.  Gastrointestinal: Negative.  Negative for abdominal pain.  Genitourinary: Negative.   Musculoskeletal:  Positive for joint pain. Negative for back pain and neck pain.  Skin: Negative.  Negative for rash.  Neurological: Negative.  Negative for sensory change, focal weakness and weakness.  Psychiatric/Behavioral: Negative.  The patient is not nervous/anxious.     As per HPI. Otherwise, a complete review of systems is negative.  PAST MEDICAL HISTORY: Past Medical History:  Diagnosis Date   Achilles tendonitis    Anemia    Anxiety    Arthritis    back and neck   Asthma    Bandemia    Complication of anesthesia    hard time waking up after neck surgery and 2 breathing txs during surgery   Diverticulitis    Dysrhythmia    Endometriosis    Fibromyalgia 06/07/2017   GERD (gastroesophageal reflux disease)    H/O hiatal hernia    small   Headache(784.0)    Hypertension    Hypothyroidism    IBS (irritable bowel syndrome)    MGUS (monoclonal gammopathy of unknown significance)    SEEN AT [facility]ER [facility]ENTER EVERY 6 MONTHS   Migraine    complex   Mitral valve prolapse    Mitral valve prolapse    Murmur, cardiac    Pneumonia    Seizure (HCC)    ONLY AS A BABY   Seizure (HCC)    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HCC)    PT DENIES RESIDUAL EFFECTS-THIS HAPPENED AT AGE 46 PER PT    PAST SURGICAL HISTORY: Past Surgical History:  Procedure Laterality Date   ABDOMINAL HYSTERECTOMY  ANTERIOR CERVICAL DECOMP/DISCECTOMY FUSION     APPENDECTOMY     CHOLECYSTECTOMY     COLONOSCOPY N/A 09/12/2020   Procedure: COLONOSCOPY;  Surgeon: Maryruth Ole DASEN, MD;  Location: [facility] ENDOSCOPY;  Service: Endoscopy;  Laterality: N/A;   ESOPHAGOGASTRODUODENOSCOPY N/A 09/12/2020   Procedure: ESOPHAGOGASTRODUODENOSCOPY (EGD);  Surgeon: Maryruth Ole DASEN, MD;  Location: [facility] ENDOSCOPY;  Service: Endoscopy;  Laterality: N/A;   LAPAROSCOPY  12/15/2012   Procedure:  LAPAROSCOPY DIAGNOSTIC;  Surgeon: Charlie JINNY Flowers, MD;  Location: [facility] ORS;  Service: Gynecology;  Laterality: N/A;   MOUTH SURGERY     ROBOTIC ASSISTED TOTAL HYSTERECTOMY WITH BILATERAL SALPINGO OOPHERECTOMY Bilateral 02/25/2020   Procedure: XI ROBOTIC ASSISTED TOTAL HYSTERECTOMY WITH BILATERAL SALPINGECTOMY;  Surgeon: Ward, Mitzie BROCKS, MD;  Location: [facility] ORS;  Service: Gynecology;  Laterality: Bilateral;    FAMILY HISTORY Family History  Problem Relation Age of Onset   Arrhythmia Mother    Hypertension Mother    Heart disease Father        ADVANCED DIRECTIVES:    HEALTH MAINTENANCE: Social History   Tobacco Use   Smoking status: Every Day    Current packs/day: 0.50    Average packs/day: 0.5 packs/day for 6.0 years (3.0 ttl pk-yrs)    Types: Cigarettes   Smokeless tobacco: Never  Vaping Use   Vaping status: Never Used  Substance Use Topics   Alcohol use: Yes    Comment: occasionally   Drug use: No     Colonoscopy:  PAP:  Bone density:  Lipid panel:  Allergies  Allergen Reactions   Fish-Derived Products Anaphylaxis   Shellfish Allergy Anaphylaxis   Sulfa Antibiotics Anaphylaxis   Tramadol Hcl Shortness Of Breath and Nausea Only    Passed out   Codeine Itching   Tylenol  [Acetaminophen ]     Cant take tylenol  #3   Cefdinir Rash    Can take amoxicillin  without problems   Tape Rash    PAPER TAPE OK TO USE PAPER TAPE OK TO USE    Current Outpatient Medications  Medication Sig Dispense Refill   acetaminophen  (TYLENOL ) 325 MG tablet Take 650 mg by mouth every 6 (six) hours as needed.     albuterol  (VENTOLIN  HFA) 108 (90 Base) MCG/ACT inhaler Inhale 1 puff into the lungs every morning. And PRN     budesonide -formoterol (SYMBICORT) 160-4.5 MCG/ACT inhaler Inhale into the lungs.     carvedilol (COREG) 6.25 MG tablet Take 6.25 mg by mouth 2 (two) times daily.     hydrochlorothiazide  (HYDRODIURIL ) 25 MG tablet Take 25 mg by mouth daily.     levothyroxine  (SYNTHROID ,  LEVOTHROID) 75 MCG tablet Take 75 mcg by mouth daily before breakfast.      lidocaine  (LIDODERM ) 5 % Place 1 patch onto the skin every 12 (twelve) hours. Remove & Discard patch within 12 hours or as directed by MD 10 patch 0   losartan (COZAAR) 100 MG tablet Take 100 mg by mouth at bedtime.     omeprazole (PRILOSEC) 20 MG capsule Take 20 mg by mouth 2 (two) times daily before a meal.     oxyCODONE -acetaminophen  (PERCOCET) 5-325 MG tablet Take 1 tablet by mouth every 4 (four) hours as needed for severe pain. 8 tablet 0   VITAMIN D , CHOLECALCIFEROL, PO Take 1 capsule by mouth daily.     No current facility-administered medications for this visit.    OBJECTIVE: There were no vitals filed for this visit.    There is no height or weight on file  to calculate BMI.    ECOG FS:0 - Asymptomatic  General: Well-developed, well-nourished, no acute distress. HEENT: Normocephalic. Neuro: Alert, answering all questions appropriately. Cranial nerves grossly intact. Psych: Normal affect.  LAB RESULTS:  Lab Results  Component Value Date   NA 136 01/19/2024   K 3.3 (L) 01/19/2024   CL 101 01/19/2024   CO2 24 01/19/2024   GLUCOSE 114 (H) 01/19/2024   BUN 13 01/19/2024   CREATININE 0.59 01/19/2024   CALCIUM 9.1 01/19/2024   PROT 6.9 06/07/2017   ALBUMIN 4.0 06/07/2017   AST 17 06/07/2017   ALT 19 06/07/2017   ALKPHOS 88 06/07/2017   BILITOT 0.2 06/07/2017   GFRNONAA >60 01/19/2024   GFRAA >60 06/09/2020    Lab Results  Component Value Date   WBC 15.1 (H) 01/19/2024   NEUTROABS 9.1 (H) 01/19/2024   HGB 13.4 01/19/2024   HCT 38.1 01/19/2024   MCV 83.4 01/19/2024   PLT 320 01/19/2024   Lab Results  Component Value Date   TOTALPROTELP 7.0 01/19/2024   ALBUMINELP 3.6 01/19/2024   A1GS 0.3 01/19/2024   A2GS 0.9 01/19/2024   BETS 1.5 (H) 01/19/2024   GAMS 0.7 01/19/2024   MSPIKE 0.7 (H) 01/19/2024   SPEI Comment 01/19/2024     STUDIES: No results found.  ASSESSMENT: MGUS,  leukocytosis.  PLAN:    MGUS: Patient's M spike has ranged from not observed to 0.7 since at least February 2017.  Her most recent result is 0.7.  Her IgA levels have ranged from 547-949 over the same timeframe.  Today's result is 685.  Finally, her kappa free light chains have ranged from 35.6-58.8.  Today's result is 46.1.  Metastatic bone survey completed on February 17, 2016 reviewed independently with no obvious lesions.  Recent nuclear med bone scan was also reported as negative.  She has no evidence of endorgan damage.  No intervention is needed at this time.  Patient does not require bone marrow biopsy.  Return to clinic in 1 year with repeat laboratory work and video assisted telemedicine visit.   Leukocytosis: Chronic and unchanged.  Patient's white blood cell count has ranged between 13.2 and 21.3 since April 2009.  Today's result is 15.1. Previously, BCR-ABL and peripheral blood flow cytometry were negative.  Repeat peripheral blood flow cytometry was negative as well.  No further intervention is needed.  No bone marrow biopsy necessary as above. Hearing loss: Patient states she has an appointment with ENT in the near future. Pain/fatigue: Possible lupus?  Patient following up closely with rheumatology.  I provided 20 minutes of face-to-face video visit time during this encounter which included chart review, counseling, and coordination of care as documented above.     Patient expressed understanding and was in agreement with this plan. She also understands that She can call clinic at any time with any questions, concerns, or complaints.   Evalene JINNY Reusing, MD   01/26/2024 4:14 PM

## 2024-04-03 ENCOUNTER — Encounter: Payer: Self-pay | Admitting: Oncology

## 2024-04-03 ENCOUNTER — Other Ambulatory Visit
Admission: RE | Admit: 2024-04-03 | Discharge: 2024-04-03 | Disposition: A | Discharge status: Home / Self Care | Source: Ambulatory Visit | Attending: Family Medicine | Admitting: Family Medicine

## 2024-04-03 DIAGNOSIS — I10 Essential (primary) hypertension: Secondary | ICD-10-CM | POA: Diagnosis present

## 2024-04-03 LAB — CBC WITH DIFFERENTIAL/PLATELET
Abs Immature Granulocytes: 0.1 10*3/uL — ABNORMAL HIGH (ref 0.00–0.07)
Basophils Absolute: 0.1 10*3/uL (ref 0.0–0.1)
Basophils Relative: 0 %
Eosinophils Absolute: 0.4 10*3/uL (ref 0.0–0.5)
Eosinophils Relative: 3 %
HCT: 35.8 % — ABNORMAL LOW (ref 36.0–46.0)
Hemoglobin: 12.4 g/dL (ref 12.0–15.0)
Immature Granulocytes: 1 %
Lymphocytes Relative: 41 %
Lymphs Abs: 7 10*3/uL — ABNORMAL HIGH (ref 0.7–4.0)
MCH: 30 pg (ref 26.0–34.0)
MCHC: 34.6 g/dL (ref 30.0–36.0)
MCV: 86.5 fL (ref 80.0–100.0)
Monocytes Absolute: 1 10*3/uL (ref 0.1–1.0)
Monocytes Relative: 6 %
Neutro Abs: 8.6 10*3/uL — ABNORMAL HIGH (ref 1.7–7.7)
Neutrophils Relative %: 49 %
Platelets: 308 10*3/uL (ref 150–400)
RBC: 4.14 MIL/uL (ref 3.87–5.11)
RDW: 13.6 % (ref 11.5–15.5)
WBC: 17.2 10*3/uL — ABNORMAL HIGH (ref 4.0–10.5)
nRBC: 0 % (ref 0.0–0.2)

## 2024-07-04 ENCOUNTER — Other Ambulatory Visit: Payer: Self-pay | Admitting: Physician Assistant

## 2024-07-04 DIAGNOSIS — M2560 Stiffness of unspecified joint, not elsewhere classified: Secondary | ICD-10-CM

## 2024-07-04 DIAGNOSIS — M797 Fibromyalgia: Secondary | ICD-10-CM

## 2024-07-04 DIAGNOSIS — R42 Dizziness and giddiness: Secondary | ICD-10-CM

## 2024-07-04 DIAGNOSIS — R202 Paresthesia of skin: Secondary | ICD-10-CM

## 2024-07-04 DIAGNOSIS — G8929 Other chronic pain: Secondary | ICD-10-CM

## 2024-07-04 DIAGNOSIS — R519 Headache, unspecified: Secondary | ICD-10-CM

## 2024-07-04 DIAGNOSIS — H9190 Unspecified hearing loss, unspecified ear: Secondary | ICD-10-CM

## 2024-07-04 DIAGNOSIS — R200 Anesthesia of skin: Secondary | ICD-10-CM

## 2024-07-04 DIAGNOSIS — H9313 Tinnitus, bilateral: Secondary | ICD-10-CM

## 2024-07-04 DIAGNOSIS — M791 Myalgia, unspecified site: Secondary | ICD-10-CM

## 2024-07-04 DIAGNOSIS — R6889 Other general symptoms and signs: Secondary | ICD-10-CM

## 2024-07-04 DIAGNOSIS — H539 Unspecified visual disturbance: Secondary | ICD-10-CM

## 2024-07-05 ENCOUNTER — Encounter: Payer: Self-pay | Admitting: Physician Assistant

## 2024-07-13 ENCOUNTER — Ambulatory Visit
Admission: RE | Admit: 2024-07-13 | Discharge: 2024-07-13 | Disposition: A | Discharge status: Home / Self Care | Source: Ambulatory Visit | Attending: Physician Assistant | Admitting: Physician Assistant

## 2024-07-13 DIAGNOSIS — H539 Unspecified visual disturbance: Secondary | ICD-10-CM

## 2024-07-13 DIAGNOSIS — H9190 Unspecified hearing loss, unspecified ear: Secondary | ICD-10-CM

## 2024-07-13 DIAGNOSIS — R200 Anesthesia of skin: Secondary | ICD-10-CM

## 2024-07-13 DIAGNOSIS — H9313 Tinnitus, bilateral: Secondary | ICD-10-CM

## 2024-07-13 DIAGNOSIS — R6889 Other general symptoms and signs: Secondary | ICD-10-CM

## 2024-07-13 DIAGNOSIS — M2560 Stiffness of unspecified joint, not elsewhere classified: Secondary | ICD-10-CM

## 2024-07-13 DIAGNOSIS — G8929 Other chronic pain: Secondary | ICD-10-CM

## 2024-07-13 DIAGNOSIS — R519 Headache, unspecified: Secondary | ICD-10-CM

## 2024-07-13 DIAGNOSIS — R202 Paresthesia of skin: Secondary | ICD-10-CM

## 2024-07-13 DIAGNOSIS — M797 Fibromyalgia: Secondary | ICD-10-CM

## 2024-07-13 DIAGNOSIS — R42 Dizziness and giddiness: Secondary | ICD-10-CM

## 2024-07-13 DIAGNOSIS — M791 Myalgia, unspecified site: Secondary | ICD-10-CM

## 2024-07-13 MED ORDER — GADOPICLENOL 0.5 MMOL/ML IV SOLN: 10.0000 mL | Freq: Once | INTRAVENOUS | Status: DC | PRN

## 2024-08-18 ENCOUNTER — Other Ambulatory Visit

## 2024-09-09 ENCOUNTER — Other Ambulatory Visit

## 2024-10-09 ENCOUNTER — Other Ambulatory Visit: Payer: Self-pay | Admitting: Family Medicine

## 2024-10-09 DIAGNOSIS — Z1231 Encounter for screening mammogram for malignant neoplasm of breast: Secondary | ICD-10-CM

## 2024-10-25 ENCOUNTER — Other Ambulatory Visit: Payer: Self-pay | Admitting: Internal Medicine

## 2024-10-25 DIAGNOSIS — I10 Essential (primary) hypertension: Secondary | ICD-10-CM

## 2024-10-25 DIAGNOSIS — R000 Tachycardia, unspecified: Secondary | ICD-10-CM

## 2024-11-02 ENCOUNTER — Ambulatory Visit
Admission: RE | Admit: 2024-11-02 | Discharge: 2024-11-02 | Disposition: A | Discharge status: Home / Self Care | Payer: Self-pay | Source: Ambulatory Visit | Attending: Internal Medicine | Admitting: Internal Medicine

## 2024-11-02 ENCOUNTER — Ambulatory Visit: Payer: Self-pay

## 2024-11-02 DIAGNOSIS — I10 Essential (primary) hypertension: Secondary | ICD-10-CM | POA: Insufficient documentation

## 2024-11-02 DIAGNOSIS — R000 Tachycardia, unspecified: Secondary | ICD-10-CM | POA: Insufficient documentation

## 2024-11-13 ENCOUNTER — Ambulatory Visit
Admission: RE | Admit: 2024-11-13 | Discharge: 2024-11-13 | Disposition: A | Discharge status: Home / Self Care | Source: Ambulatory Visit | Attending: Family Medicine | Admitting: Family Medicine

## 2024-11-13 DIAGNOSIS — Z1231 Encounter for screening mammogram for malignant neoplasm of breast: Secondary | ICD-10-CM | POA: Insufficient documentation

## 2024-11-20 ENCOUNTER — Other Ambulatory Visit: Payer: Self-pay | Admitting: Family Medicine

## 2024-11-20 DIAGNOSIS — R928 Other abnormal and inconclusive findings on diagnostic imaging of breast: Secondary | ICD-10-CM

## 2024-11-23 ENCOUNTER — Inpatient Hospital Stay: Admission: RE | Admit: 2024-11-23 | Discharge: 2024-11-23 | Attending: Family Medicine | Admitting: Family Medicine

## 2024-11-23 DIAGNOSIS — R928 Other abnormal and inconclusive findings on diagnostic imaging of breast: Secondary | ICD-10-CM

## 2024-12-06 ENCOUNTER — Other Ambulatory Visit: Payer: Self-pay | Admitting: Family Medicine

## 2024-12-06 DIAGNOSIS — N63 Unspecified lump in unspecified breast: Secondary | ICD-10-CM

## 2025-01-28 ENCOUNTER — Inpatient Hospital Stay: Payer: Managed Care, Other (non HMO)

## 2025-01-29 ENCOUNTER — Inpatient Hospital Stay: Payer: Managed Care, Other (non HMO) | Admitting: Oncology
